# Patient Record
Sex: Female | Born: 1935
Health system: Southern US, Community
[De-identification: ages and names within clinical notes are randomized; demographics above are authoritative.]

## PROBLEM LIST (undated history)

## (undated) DIAGNOSIS — Z9289 Personal history of other medical treatment: Secondary | ICD-10-CM

## (undated) DIAGNOSIS — M545 Low back pain, unspecified: Secondary | ICD-10-CM

## (undated) DIAGNOSIS — D649 Anemia, unspecified: Secondary | ICD-10-CM

## (undated) DIAGNOSIS — C44300 Unspecified malignant neoplasm of skin of unspecified part of face: Secondary | ICD-10-CM

## (undated) DIAGNOSIS — E7800 Pure hypercholesterolemia, unspecified: Secondary | ICD-10-CM

## (undated) DIAGNOSIS — I82419 Acute embolism and thrombosis of unspecified femoral vein: Secondary | ICD-10-CM

## (undated) DIAGNOSIS — G8929 Other chronic pain: Secondary | ICD-10-CM

## (undated) DIAGNOSIS — I5022 Chronic systolic (congestive) heart failure: Secondary | ICD-10-CM

## (undated) DIAGNOSIS — K219 Gastro-esophageal reflux disease without esophagitis: Secondary | ICD-10-CM

## (undated) DIAGNOSIS — Z8719 Personal history of other diseases of the digestive system: Secondary | ICD-10-CM

## (undated) DIAGNOSIS — I10 Essential (primary) hypertension: Secondary | ICD-10-CM

## (undated) DIAGNOSIS — E119 Type 2 diabetes mellitus without complications: Secondary | ICD-10-CM

## (undated) DIAGNOSIS — I2510 Atherosclerotic heart disease of native coronary artery without angina pectoris: Secondary | ICD-10-CM

## (undated) DIAGNOSIS — C50919 Malignant neoplasm of unspecified site of unspecified female breast: Secondary | ICD-10-CM

## (undated) DIAGNOSIS — K222 Esophageal obstruction: Secondary | ICD-10-CM

## (undated) DIAGNOSIS — M1990 Unspecified osteoarthritis, unspecified site: Secondary | ICD-10-CM

## (undated) DIAGNOSIS — I2699 Other pulmonary embolism without acute cor pulmonale: Secondary | ICD-10-CM

## (undated) DIAGNOSIS — Z853 Personal history of malignant neoplasm of breast: Principal | ICD-10-CM

## (undated) DIAGNOSIS — C55 Malignant neoplasm of uterus, part unspecified: Secondary | ICD-10-CM

## (undated) DIAGNOSIS — Z7901 Long term (current) use of anticoagulants: Secondary | ICD-10-CM

## (undated) DIAGNOSIS — N189 Chronic kidney disease, unspecified: Secondary | ICD-10-CM

## (undated) DIAGNOSIS — M48061 Spinal stenosis, lumbar region without neurogenic claudication: Secondary | ICD-10-CM

## (undated) HISTORY — DX: Unspecified osteoarthritis, unspecified site: M19.90

## (undated) HISTORY — PX: CATARACT EXTRACTION W/ INTRAOCULAR LENS  IMPLANT, BILATERAL: SHX1307

## (undated) HISTORY — PX: DILATION AND CURETTAGE OF UTERUS: SHX78

## (undated) HISTORY — DX: Pure hypercholesterolemia, unspecified: E78.00

## (undated) HISTORY — DX: Essential (primary) hypertension: I10

## (undated) HISTORY — DX: Atherosclerotic heart disease of native coronary artery without angina pectoris: I25.10

## (undated) HISTORY — DX: Personal history of malignant neoplasm of breast: Z85.3

## (undated) HISTORY — PX: SHOULDER ARTHROSCOPY W/ ROTATOR CUFF REPAIR: SHX2400

---

## 1964-03-29 DIAGNOSIS — C55 Malignant neoplasm of uterus, part unspecified: Secondary | ICD-10-CM

## 1964-03-29 HISTORY — PX: ABDOMINAL HYSTERECTOMY: SHX81

## 1964-03-29 HISTORY — DX: Malignant neoplasm of uterus, part unspecified: C55

## 1964-03-29 HISTORY — PX: APPENDECTOMY: SHX54

## 1997-09-16 ENCOUNTER — Encounter: Admission: RE | Admit: 1997-09-16 | Discharge: 1997-12-15 | Payer: Self-pay | Admitting: Internal Medicine

## 1999-01-21 ENCOUNTER — Other Ambulatory Visit: Admission: RE | Admit: 1999-01-21 | Discharge: 1999-01-21 | Payer: Self-pay | Admitting: Obstetrics and Gynecology

## 1999-03-13 ENCOUNTER — Encounter: Payer: Self-pay | Admitting: Obstetrics and Gynecology

## 1999-03-13 ENCOUNTER — Encounter: Admission: RE | Admit: 1999-03-13 | Discharge: 1999-03-13 | Payer: Self-pay | Admitting: Obstetrics and Gynecology

## 2000-05-12 ENCOUNTER — Other Ambulatory Visit: Admission: RE | Admit: 2000-05-12 | Discharge: 2000-05-12 | Payer: Self-pay | Admitting: Obstetrics and Gynecology

## 2000-05-24 ENCOUNTER — Encounter: Payer: Self-pay | Admitting: Obstetrics and Gynecology

## 2000-05-24 ENCOUNTER — Encounter: Admission: RE | Admit: 2000-05-24 | Discharge: 2000-05-24 | Payer: Self-pay | Admitting: Obstetrics and Gynecology

## 2001-01-26 ENCOUNTER — Other Ambulatory Visit: Admission: RE | Admit: 2001-01-26 | Discharge: 2001-01-26 | Payer: Self-pay | Admitting: Obstetrics and Gynecology

## 2001-03-15 ENCOUNTER — Encounter: Payer: Self-pay | Admitting: Internal Medicine

## 2001-03-15 ENCOUNTER — Encounter: Admission: RE | Admit: 2001-03-15 | Discharge: 2001-03-15 | Payer: Self-pay | Admitting: Internal Medicine

## 2001-04-03 ENCOUNTER — Ambulatory Visit (HOSPITAL_COMMUNITY): Admission: RE | Admit: 2001-04-03 | Discharge: 2001-04-03 | Payer: Self-pay | Admitting: Obstetrics and Gynecology

## 2001-04-03 ENCOUNTER — Encounter: Payer: Self-pay | Admitting: Obstetrics and Gynecology

## 2001-06-02 ENCOUNTER — Encounter: Admission: RE | Admit: 2001-06-02 | Discharge: 2001-06-02 | Payer: Self-pay | Admitting: Internal Medicine

## 2001-06-02 ENCOUNTER — Encounter: Payer: Self-pay | Admitting: Internal Medicine

## 2003-01-30 ENCOUNTER — Inpatient Hospital Stay (HOSPITAL_COMMUNITY): Admission: AD | Admit: 2003-01-30 | Discharge: 2003-02-01 | Payer: Self-pay | Admitting: Internal Medicine

## 2003-01-31 ENCOUNTER — Encounter (INDEPENDENT_AMBULATORY_CARE_PROVIDER_SITE_OTHER): Payer: Self-pay | Admitting: Specialist

## 2003-03-02 ENCOUNTER — Emergency Department (HOSPITAL_COMMUNITY): Admission: EM | Admit: 2003-03-02 | Discharge: 2003-03-02 | Payer: Self-pay | Admitting: Emergency Medicine

## 2003-03-26 ENCOUNTER — Inpatient Hospital Stay (HOSPITAL_COMMUNITY): Admission: EM | Admit: 2003-03-26 | Discharge: 2003-04-03 | Payer: Self-pay | Admitting: Emergency Medicine

## 2003-03-28 ENCOUNTER — Encounter: Payer: Self-pay | Admitting: Cardiology

## 2003-03-30 HISTORY — PX: CORONARY ARTERY BYPASS GRAFT: SHX141

## 2003-04-01 HISTORY — PX: CORONARY ANGIOPLASTY WITH STENT PLACEMENT: SHX49

## 2003-07-31 ENCOUNTER — Encounter (HOSPITAL_COMMUNITY): Admission: RE | Admit: 2003-07-31 | Discharge: 2003-09-09 | Payer: Self-pay | Admitting: Internal Medicine

## 2003-09-13 ENCOUNTER — Encounter: Admission: RE | Admit: 2003-09-13 | Discharge: 2003-09-13 | Payer: Self-pay | Admitting: Gastroenterology

## 2003-11-07 ENCOUNTER — Inpatient Hospital Stay (HOSPITAL_COMMUNITY): Admission: AD | Admit: 2003-11-07 | Discharge: 2003-11-20 | Payer: Self-pay | Admitting: Cardiology

## 2003-11-08 ENCOUNTER — Encounter: Payer: Self-pay | Admitting: Cardiology

## 2003-12-13 ENCOUNTER — Encounter: Admission: RE | Admit: 2003-12-13 | Discharge: 2003-12-13 | Payer: Self-pay | Admitting: Cardiothoracic Surgery

## 2004-01-24 IMAGING — CT CT HEAD W/O CM
1 series · 16 of 28 positions shown, 20 images · non-contrast
Comparison: none

CLINICAL DATA: Fall.  
 CT BRAIN WITHOUT CONTRAST
 There is mild atrophy and mild chronic ischemic change in the white matter.  No intracranial hemorrhage or mass is seen.  Soft tissue swelling and laceration is seen in the left frontal area. There is no skull fracture. 
 IMPRESSION
 No acute intracranial abnormality. 

 ree

[Series 2: trauma head · axial · 0.47mm/px · z∈[+123,+251]mm · 16 of 28 slices shown, 20 images]
[im 2/28  brain]
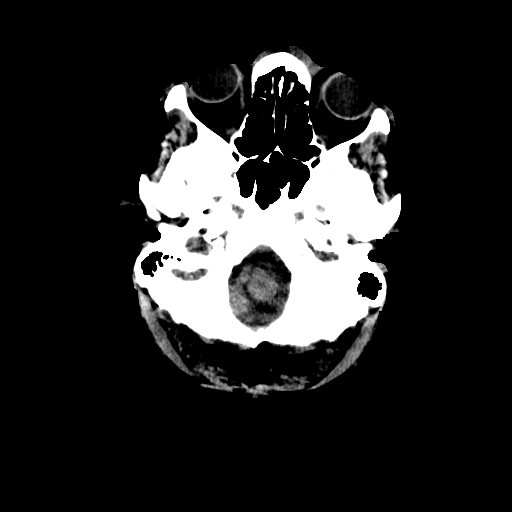
[im 2/28  bone]
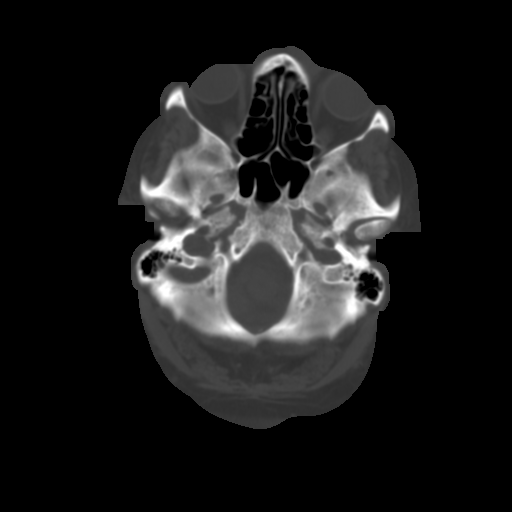
[im 4/28  brain]
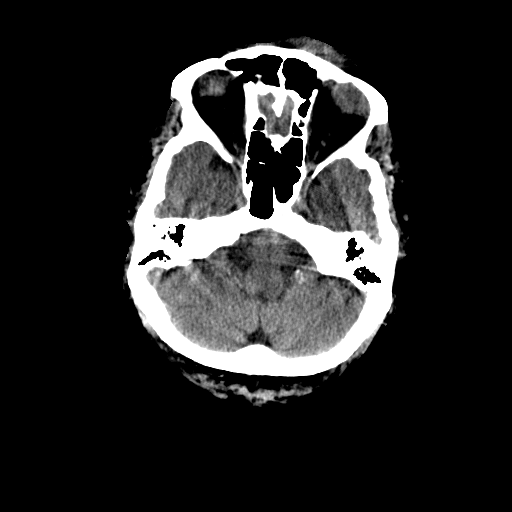
[im 6/28  brain]
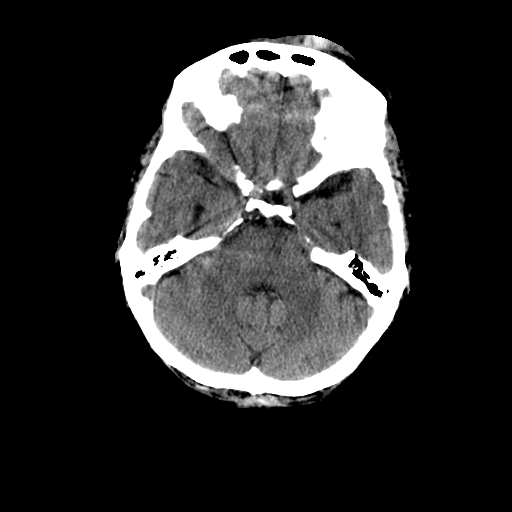
[im 7/28  brain]
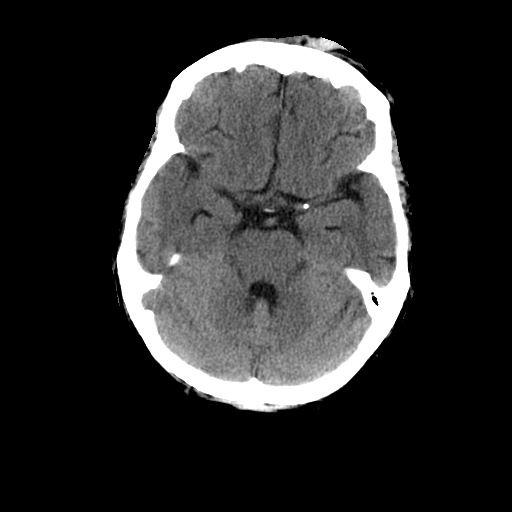
[im 9/28  brain]
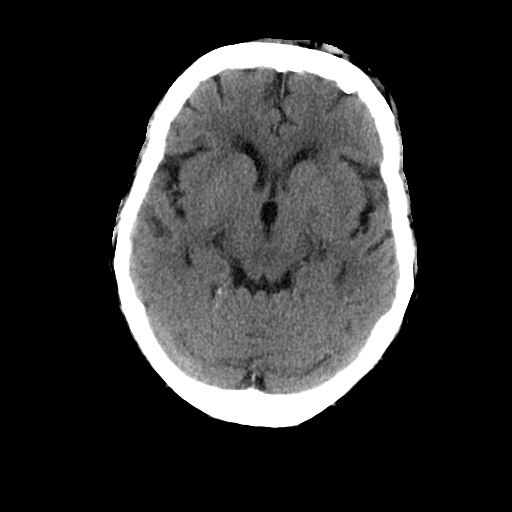
[im 9/28  bone]
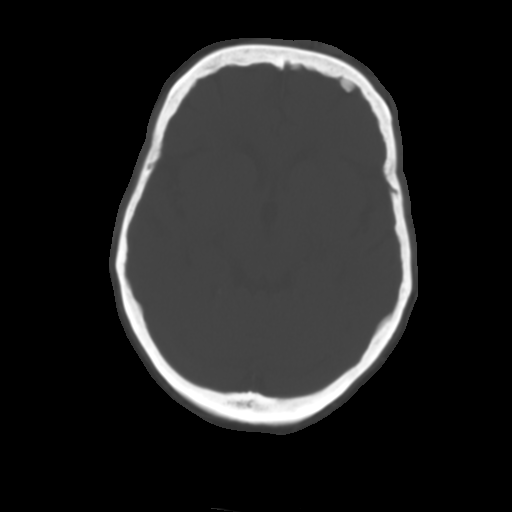
[im 10/28  brain]
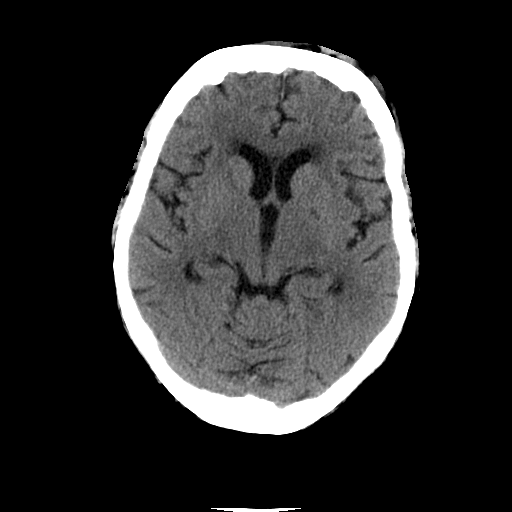
[im 12/28  brain]
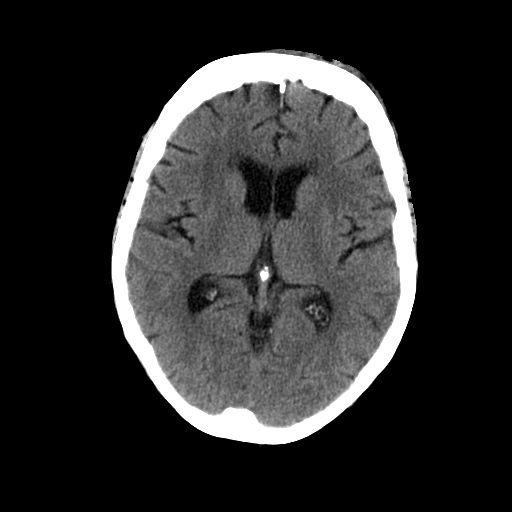
[im 14/28  brain]
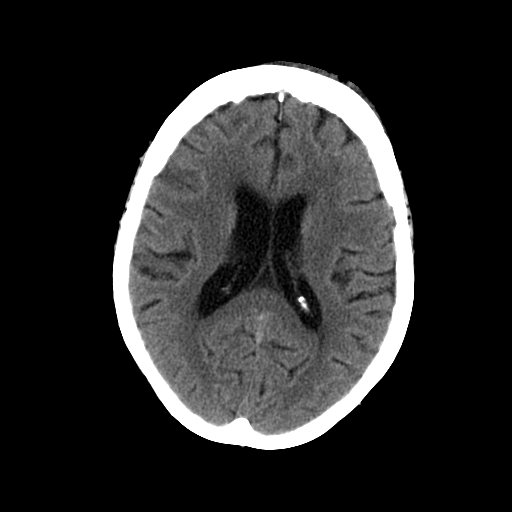
[im 15/28  brain]
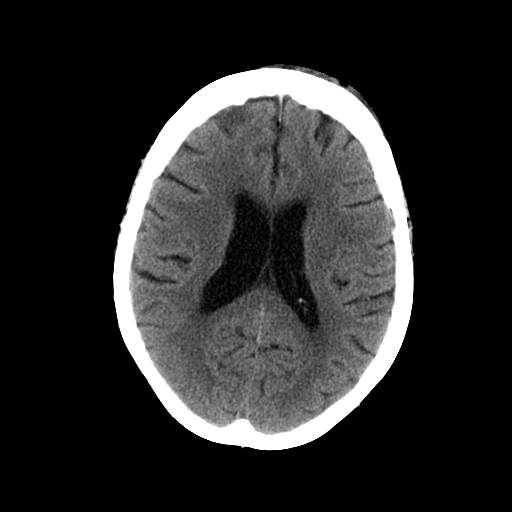
[im 15/28  bone]
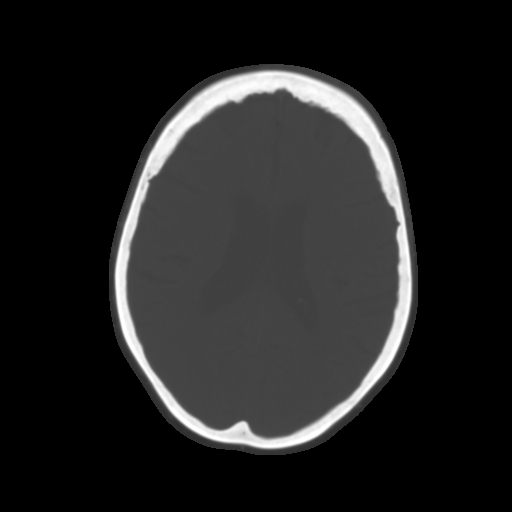
[im 17/28  brain]
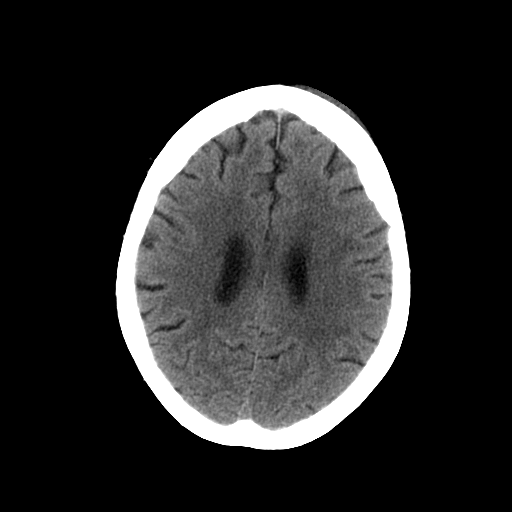
[im 19/28  brain]
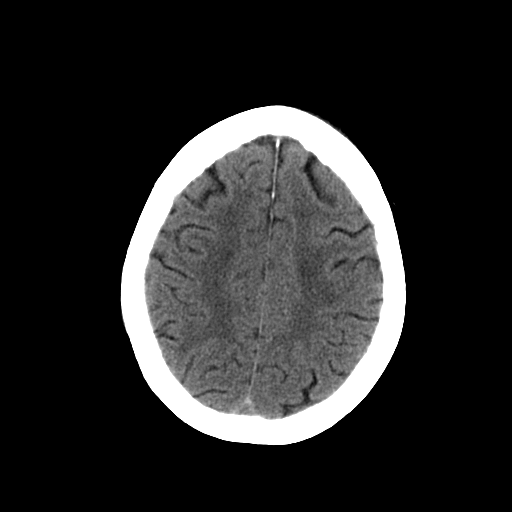
[im 20/28  brain]
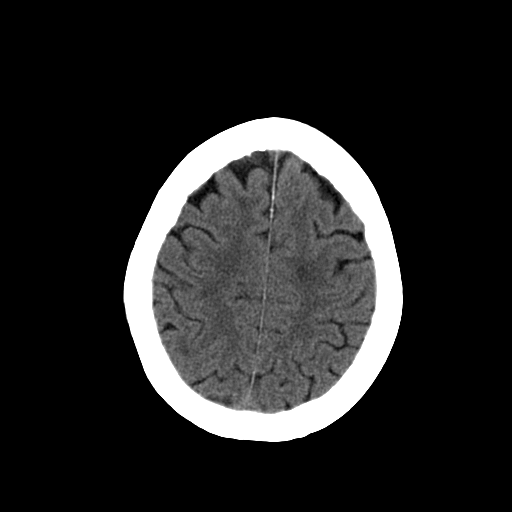
[im 22/28  brain]
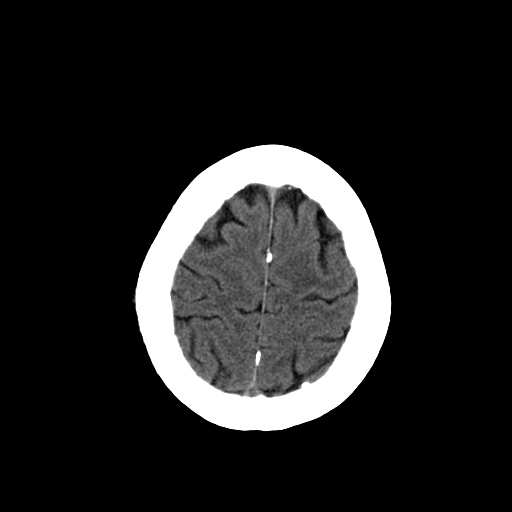
[im 22/28  bone]
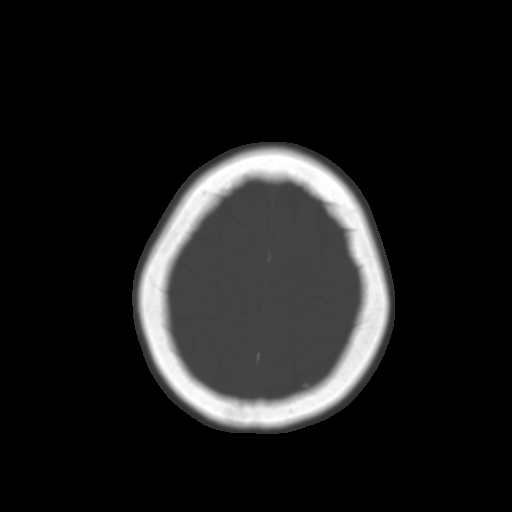
[im 23/28  brain]
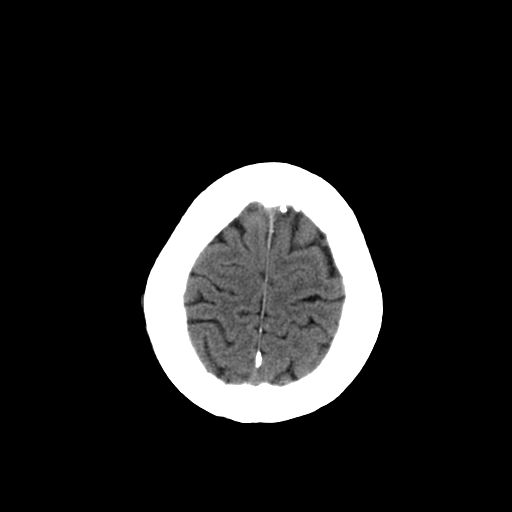
[im 25/28  brain]
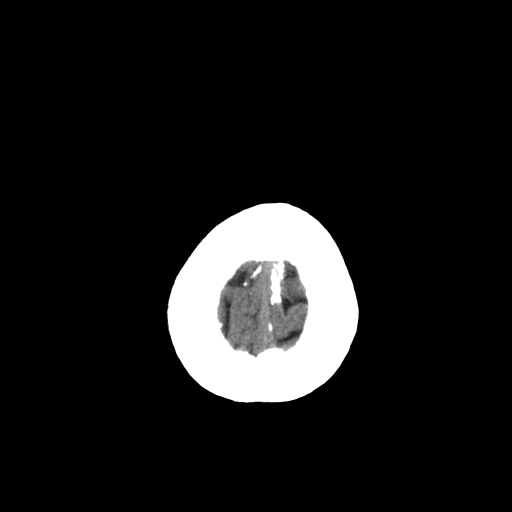
[im 27/28  brain]
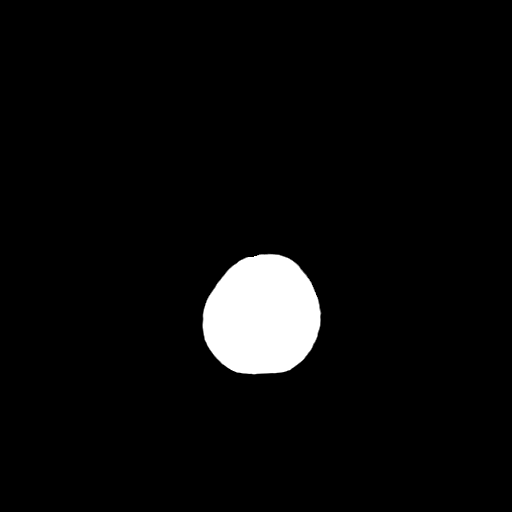

[16 of 28 positions shown; findings below may reference images not displayed]

## 2004-11-05 IMAGING — CR DG CHEST 2V
2 series · 2 of 2 positions shown · non-contrast
Comparison: 11/17/03.

CLINICAL DATA: Coronary artery disease.
 PA AND LATERAL CHEST ? 12/13/03

[view not recorded (1 of 2)]
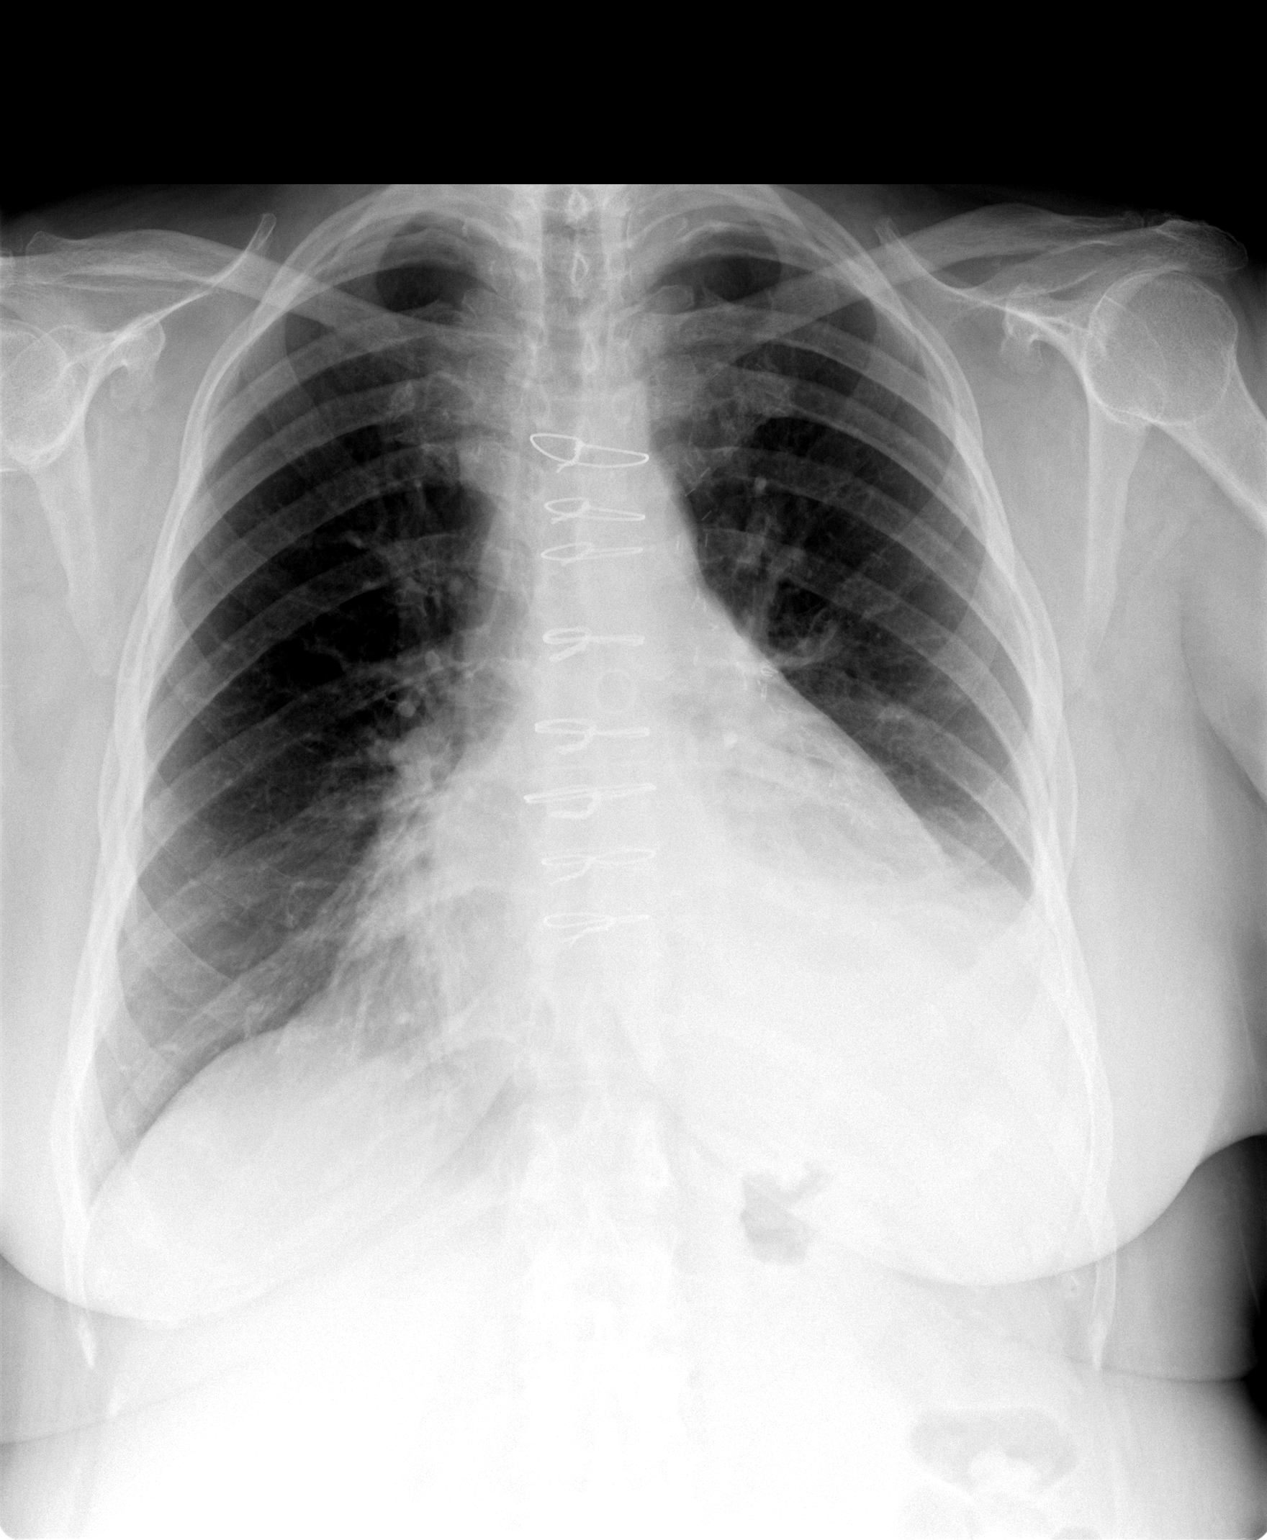

[view not recorded (2 of 2)]
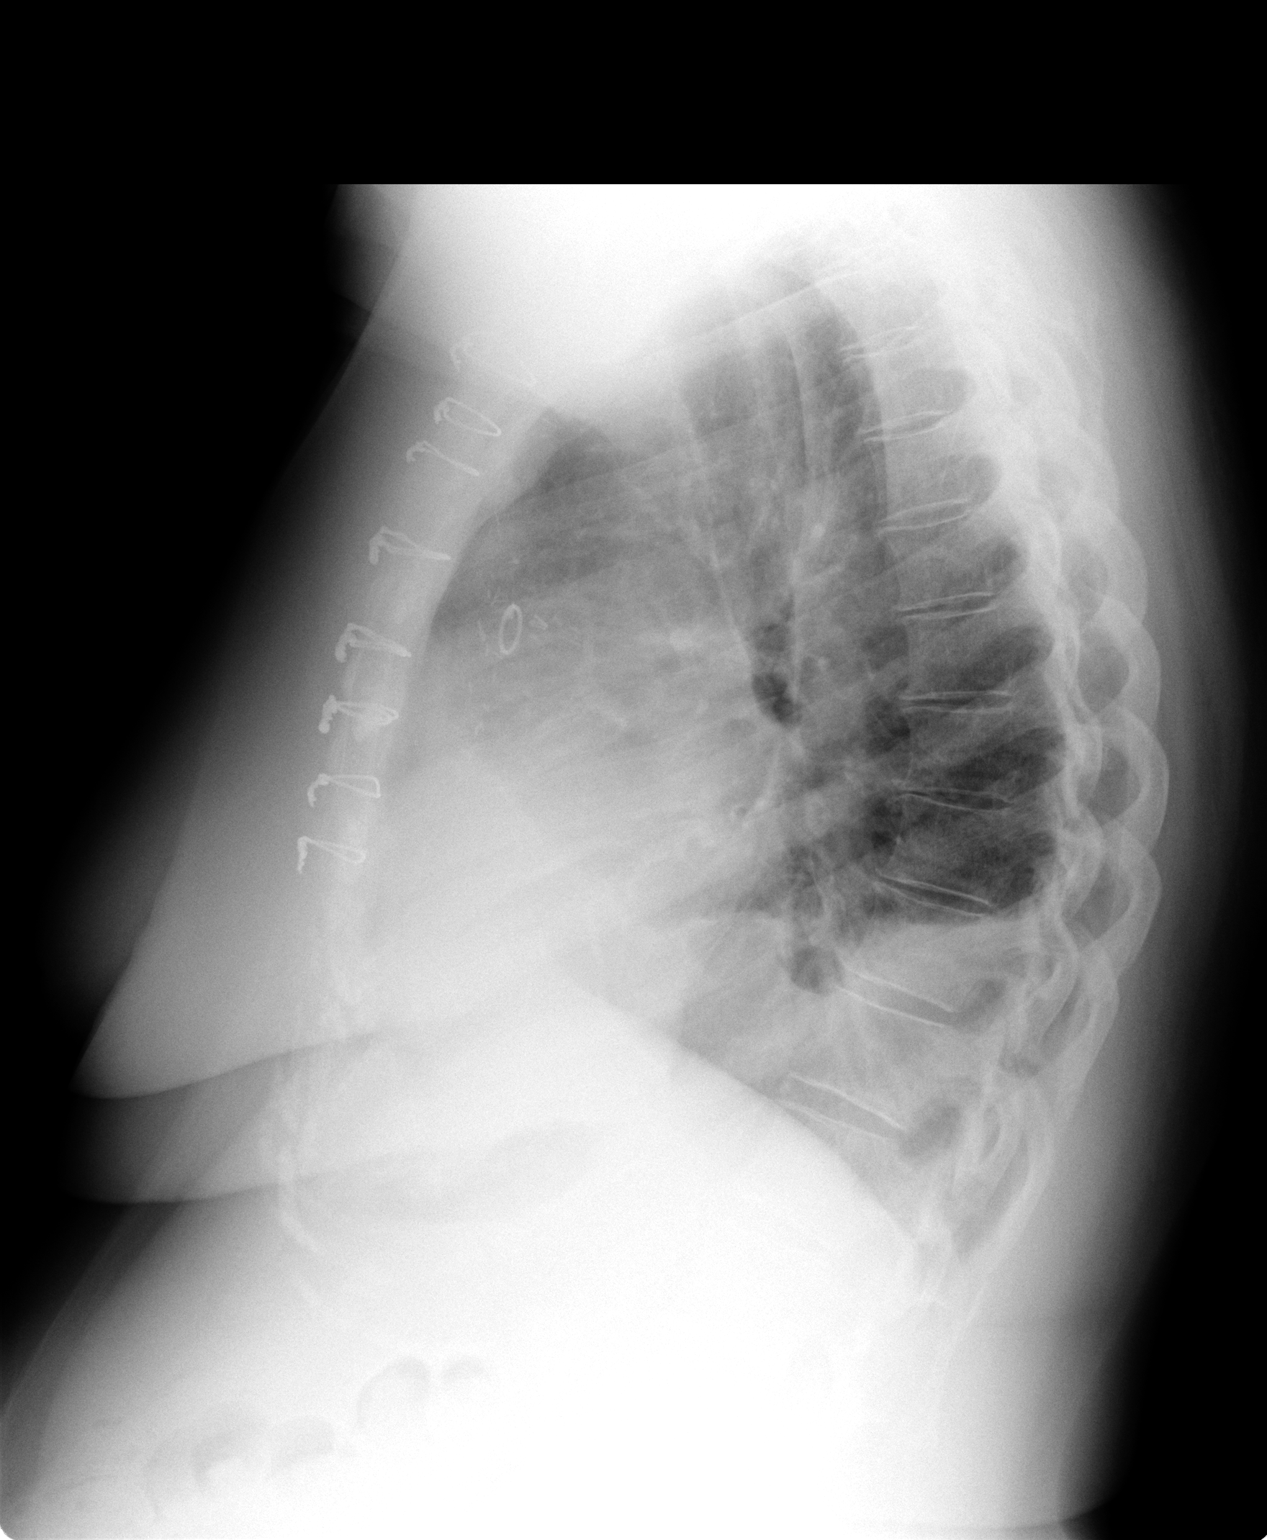

[2 of 2 positions shown; findings below may reference images not displayed]

There is mild cardiomegaly.  The pulmonary vascularity is normal.  Right effusion has resolved since the prior exam. However, the left effusion has increased moderately.  
 Evidence of previous CABG.
 IMPRESSION
 Increase in left effusion.  Resolution of the effusion on the right.  Resolution of right base atelectasis.

## 2005-10-26 ENCOUNTER — Encounter: Admission: RE | Admit: 2005-10-26 | Discharge: 2005-10-26 | Payer: Self-pay | Admitting: Endocrinology

## 2008-03-29 HISTORY — PX: BREAST LUMPECTOMY: SHX2

## 2008-04-25 ENCOUNTER — Encounter (HOSPITAL_BASED_OUTPATIENT_CLINIC_OR_DEPARTMENT_OTHER): Payer: Self-pay | Admitting: Internal Medicine

## 2008-04-25 ENCOUNTER — Ambulatory Visit (HOSPITAL_COMMUNITY): Admission: RE | Admit: 2008-04-25 | Discharge: 2008-04-25 | Payer: Self-pay | Admitting: Internal Medicine

## 2008-05-21 ENCOUNTER — Ambulatory Visit (HOSPITAL_COMMUNITY): Admission: RE | Admit: 2008-05-21 | Discharge: 2008-05-21 | Payer: Self-pay | Admitting: Surgery

## 2008-05-21 ENCOUNTER — Encounter (INDEPENDENT_AMBULATORY_CARE_PROVIDER_SITE_OTHER): Payer: Self-pay | Admitting: Surgery

## 2008-05-23 ENCOUNTER — Ambulatory Visit: Payer: Self-pay | Admitting: Oncology

## 2008-11-03 ENCOUNTER — Ambulatory Visit: Payer: Self-pay | Admitting: Internal Medicine

## 2008-11-03 ENCOUNTER — Inpatient Hospital Stay (HOSPITAL_COMMUNITY): Admission: EM | Admit: 2008-11-03 | Discharge: 2008-11-06 | Payer: Self-pay | Admitting: Emergency Medicine

## 2008-11-04 ENCOUNTER — Ambulatory Visit: Payer: Self-pay | Admitting: Gastroenterology

## 2008-11-04 ENCOUNTER — Encounter: Payer: Self-pay | Admitting: Internal Medicine

## 2008-11-07 ENCOUNTER — Encounter: Payer: Self-pay | Admitting: Internal Medicine

## 2008-11-07 DIAGNOSIS — D649 Anemia, unspecified: Secondary | ICD-10-CM

## 2008-11-11 ENCOUNTER — Telehealth (INDEPENDENT_AMBULATORY_CARE_PROVIDER_SITE_OTHER): Payer: Self-pay

## 2008-11-12 ENCOUNTER — Telehealth (INDEPENDENT_AMBULATORY_CARE_PROVIDER_SITE_OTHER): Payer: Self-pay

## 2008-12-12 ENCOUNTER — Ambulatory Visit: Payer: Self-pay | Admitting: Internal Medicine

## 2008-12-12 DIAGNOSIS — Z860100 Personal history of colon polyps, unspecified: Secondary | ICD-10-CM | POA: Insufficient documentation

## 2008-12-12 DIAGNOSIS — R1310 Dysphagia, unspecified: Secondary | ICD-10-CM | POA: Insufficient documentation

## 2008-12-12 DIAGNOSIS — Z8719 Personal history of other diseases of the digestive system: Secondary | ICD-10-CM

## 2008-12-12 DIAGNOSIS — K222 Esophageal obstruction: Secondary | ICD-10-CM

## 2008-12-12 DIAGNOSIS — K2210 Ulcer of esophagus without bleeding: Secondary | ICD-10-CM | POA: Insufficient documentation

## 2008-12-16 ENCOUNTER — Encounter: Payer: Self-pay | Admitting: Internal Medicine

## 2009-01-08 ENCOUNTER — Encounter: Payer: Self-pay | Admitting: Internal Medicine

## 2009-01-08 ENCOUNTER — Ambulatory Visit (HOSPITAL_COMMUNITY): Admission: RE | Admit: 2009-01-08 | Discharge: 2009-01-08 | Payer: Self-pay | Admitting: Gastroenterology

## 2009-01-08 ENCOUNTER — Ambulatory Visit: Payer: Self-pay | Admitting: Internal Medicine

## 2009-01-12 ENCOUNTER — Encounter: Payer: Self-pay | Admitting: Internal Medicine

## 2009-01-14 ENCOUNTER — Encounter: Payer: Self-pay | Admitting: Internal Medicine

## 2009-11-10 ENCOUNTER — Encounter
Admission: RE | Admit: 2009-11-10 | Discharge: 2009-11-10 | Payer: Self-pay | Source: Home / Self Care | Admitting: Neurological Surgery

## 2009-12-18 ENCOUNTER — Ambulatory Visit: Payer: Self-pay | Admitting: Cardiology

## 2010-03-29 HISTORY — PX: MASTECTOMY PARTIAL / LUMPECTOMY: SUR851

## 2010-04-19 ENCOUNTER — Encounter (HOSPITAL_BASED_OUTPATIENT_CLINIC_OR_DEPARTMENT_OTHER): Payer: Self-pay | Admitting: Internal Medicine

## 2010-04-29 ENCOUNTER — Encounter (HOSPITAL_COMMUNITY): Payer: Medicare HMO | Attending: Internal Medicine

## 2010-04-29 DIAGNOSIS — Z01812 Encounter for preprocedural laboratory examination: Secondary | ICD-10-CM | POA: Insufficient documentation

## 2010-04-29 DIAGNOSIS — C50019 Malignant neoplasm of nipple and areola, unspecified female breast: Secondary | ICD-10-CM | POA: Insufficient documentation

## 2010-04-29 LAB — CBC
HCT: 35.6 % — ABNORMAL LOW (ref 36.0–46.0)
Hemoglobin: 11.1 g/dL — ABNORMAL LOW (ref 12.0–15.0)
MCH: 29.4 pg (ref 26.0–34.0)
MCHC: 31.2 g/dL (ref 30.0–36.0)
MCV: 94.2 fL (ref 78.0–100.0)
Platelets: 257 10*3/uL (ref 150–400)
RBC: 3.78 MIL/uL — ABNORMAL LOW (ref 3.87–5.11)
RDW: 13.4 % (ref 11.5–15.5)
WBC: 7.7 10*3/uL (ref 4.0–10.5)

## 2010-04-29 LAB — BASIC METABOLIC PANEL
BUN: 29 mg/dL — ABNORMAL HIGH (ref 6–23)
CO2: 26 mEq/L (ref 19–32)
Calcium: 9.5 mg/dL (ref 8.4–10.5)
Chloride: 108 mEq/L (ref 96–112)
Creatinine, Ser: 1.5 mg/dL — ABNORMAL HIGH (ref 0.4–1.2)
GFR calc Af Amer: 40 mL/min — ABNORMAL LOW (ref >60)
GFR calc non Af Amer: 33 mL/min — ABNORMAL LOW (ref >60)
Glucose, Bld: 67 mg/dL — ABNORMAL LOW (ref 70–99)
Potassium: 4.6 mEq/L (ref 3.5–5.1)
Sodium: 145 mEq/L (ref 135–145)

## 2010-04-29 LAB — SURGICAL PCR SCREEN
MRSA, PCR: NEGATIVE
Staphylococcus aureus: POSITIVE — AB

## 2010-04-30 ENCOUNTER — Ambulatory Visit (HOSPITAL_COMMUNITY)
Admission: RE | Admit: 2010-04-30 | Discharge: 2010-04-30 | Disposition: A | Discharge status: Home / Self Care | Payer: Medicare HMO | Attending: Surgery | Admitting: Surgery

## 2010-04-30 ENCOUNTER — Other Ambulatory Visit: Payer: Self-pay | Admitting: Surgery

## 2010-04-30 DIAGNOSIS — C50019 Malignant neoplasm of nipple and areola, unspecified female breast: Secondary | ICD-10-CM | POA: Insufficient documentation

## 2010-04-30 LAB — GLUCOSE, CAPILLARY
Glucose-Capillary: 86 mg/dL (ref 70–99)
Glucose-Capillary: 178 mg/dL — ABNORMAL HIGH (ref 70–99)

## 2010-05-02 NOTE — Op Note (Signed)
  [name], Jacqueline Zuniga                ACCOUNT NO.:  [id number]  MEDICAL RECORD NO.:  [id number]           PATIENT TYPE:  O  LOCATION:  SDSC                         FACILITY:  [facility]  PHYSICIAN:  Currie Paris, M.D.DATE OF BIRTH:  September 01, 1925  DATE OF PROCEDURE:  04/30/2010 DATE OF DISCHARGE:  04/30/2010                              OPERATIVE REPORT   PREOPERATIVE DIAGNOSIS:  Paget disease, left breast.  POSTOPERATIVE DIAGNOSIS:  Paget disease, left breast.  PROCEDURE:  Partial left mastectomy.  SURGEON:  Currie Paris, MD  ANESTHESIA:  General.  CLINICAL HISTORY:  This is an 77 year old lady that has been previously treated for a right breast cancer who recently presented with some changes in the nipple of the left breast.  Punch biopsy showed Paget's. Mammogram was unremarkable.  BSGI showed a 1.5-cm area directly under the nipple of activity, the rest of breast was normal.  After discussion with the patient, we elected to proceed to a central partial left mastectomy to include the nipple-areolar complex.  DESCRIPTION OF PROCEDURE:  I saw the patient in the holding area and she had no further questions.  We identified the left side as the operative side and I initialed the left breast.  The patient was taken to the operating room, and after satisfactory general (LMA) anesthesia had been obtained the left breast was prepped and draped.  The time-out was done.  I made an elliptical incision taking only the skin of the nipple-areolar complex.  I divided the breast tissue above the areola by making a small skin flap and going down to the chest wall and coming around medially at the medial end of my incision, then making a small skin flap inferiorly going down all the way to the fascia and taking full thickness of skin, subcu tissue, breast tissue.  This all appeared to be grossly normal tissue.  I made sure everything was dry.  I put 20 mL of 0.25% plain Marcaine  in. I put clips in to mark the margins of the excision.  The incision was then closed in layers with 3-0 Vicryl, 4-0 Monocryl subcuticular plus Dermabond.  The patient tolerated the procedure well.  There were no complications. Counts were correct.     Currie Paris, M.D.     CJS/MEDQ  D:  04/30/2010  T:  05/01/2010  Job:  [id number]  Electronically Signed by Cyndia Bent M.D. on 05/01/2010 07:29:16 AM

## 2010-05-15 ENCOUNTER — Ambulatory Visit: Payer: Medicare HMO | Attending: Radiation Oncology | Admitting: Radiation Oncology

## 2010-05-15 DIAGNOSIS — D0590 Unspecified type of carcinoma in situ of unspecified breast: Secondary | ICD-10-CM | POA: Insufficient documentation

## 2010-05-15 DIAGNOSIS — Z171 Estrogen receptor negative status [ER-]: Secondary | ICD-10-CM | POA: Insufficient documentation

## 2010-05-15 DIAGNOSIS — M889 Osteitis deformans of unspecified bone: Secondary | ICD-10-CM | POA: Insufficient documentation

## 2010-06-11 ENCOUNTER — Ambulatory Visit: Payer: Self-pay | Admitting: Cardiology

## 2010-06-19 ENCOUNTER — Ambulatory Visit: Payer: Self-pay | Admitting: Cardiology

## 2010-07-02 ENCOUNTER — Ambulatory Visit: Payer: Self-pay | Admitting: Cardiology

## 2010-07-02 LAB — GLUCOSE, CAPILLARY
Glucose-Capillary: 129 mg/dL — ABNORMAL HIGH (ref 70–99)
Glucose-Capillary: 63 mg/dL — ABNORMAL LOW (ref 70–99)

## 2010-07-04 LAB — GLUCOSE, CAPILLARY
Glucose-Capillary: 100 mg/dL — ABNORMAL HIGH (ref 70–99)
Glucose-Capillary: 107 mg/dL — ABNORMAL HIGH (ref 70–99)
Glucose-Capillary: 141 mg/dL — ABNORMAL HIGH (ref 70–99)
Glucose-Capillary: 147 mg/dL — ABNORMAL HIGH (ref 70–99)
Glucose-Capillary: 149 mg/dL — ABNORMAL HIGH (ref 70–99)
Glucose-Capillary: 156 mg/dL — ABNORMAL HIGH (ref 70–99)
Glucose-Capillary: 161 mg/dL — ABNORMAL HIGH (ref 70–99)
Glucose-Capillary: 161 mg/dL — ABNORMAL HIGH (ref 70–99)
Glucose-Capillary: 232 mg/dL — ABNORMAL HIGH (ref 70–99)
Glucose-Capillary: 242 mg/dL — ABNORMAL HIGH (ref 70–99)
Glucose-Capillary: 52 mg/dL — ABNORMAL LOW (ref 70–99)
Glucose-Capillary: 58 mg/dL — ABNORMAL LOW (ref 70–99)
Glucose-Capillary: 83 mg/dL (ref 70–99)
Glucose-Capillary: 93 mg/dL (ref 70–99)

## 2010-07-04 LAB — DIFFERENTIAL
Basophils Absolute: 0 10*3/uL (ref 0.0–0.1)
Basophils Absolute: 0 10*3/uL (ref 0.0–0.1)
Basophils Absolute: 0 10*3/uL (ref 0.0–0.1)
Basophils Absolute: 0 10*3/uL (ref 0.0–0.1)
Basophils Absolute: 0 10*3/uL (ref 0.0–0.1)
Basophils Relative: 0 % (ref 0–1)
Basophils Relative: 0 % (ref 0–1)
Basophils Relative: 0 % (ref 0–1)
Eosinophils Absolute: 0.3 10*3/uL (ref 0.0–0.7)
Lymphocytes Relative: 20 % (ref 12–46)
Lymphocytes Relative: 25 % (ref 12–46)
Lymphocytes Relative: 27 % (ref 12–46)
Lymphocytes Relative: 32 % (ref 12–46)
Lymphs Abs: 2.4 10*3/uL (ref 0.7–4.0)
Monocytes Absolute: 0.5 10*3/uL (ref 0.1–1.0)
Monocytes Absolute: 0.6 10*3/uL (ref 0.1–1.0)
Monocytes Absolute: 0.7 10*3/uL (ref 0.1–1.0)
Monocytes Relative: 9 % (ref 3–12)
Neutro Abs: 3.3 10*3/uL (ref 1.7–7.7)
Neutro Abs: 3.9 10*3/uL (ref 1.7–7.7)
Neutro Abs: 4.4 10*3/uL (ref 1.7–7.7)
Neutro Abs: 6.1 10*3/uL (ref 1.7–7.7)
Neutro Abs: 8.3 10*3/uL — ABNORMAL HIGH (ref 1.7–7.7)
Neutrophils Relative %: 56 % (ref 43–77)
Neutrophils Relative %: 74 % (ref 43–77)

## 2010-07-04 LAB — BASIC METABOLIC PANEL
BUN: 29 mg/dL — ABNORMAL HIGH (ref 6–23)
BUN: 46 mg/dL — ABNORMAL HIGH (ref 6–23)
BUN: 82 mg/dL — ABNORMAL HIGH (ref 6–23)
CO2: 22 mEq/L (ref 19–32)
CO2: 22 mEq/L (ref 19–32)
Calcium: 7.9 mg/dL — ABNORMAL LOW (ref 8.4–10.5)
Calcium: 8 mg/dL — ABNORMAL LOW (ref 8.4–10.5)
Calcium: 8.1 mg/dL — ABNORMAL LOW (ref 8.4–10.5)
Calcium: 8.3 mg/dL — ABNORMAL LOW (ref 8.4–10.5)
Calcium: 8.4 mg/dL (ref 8.4–10.5)
Calcium: 9.3 mg/dL (ref 8.4–10.5)
Creatinine, Ser: 1.52 mg/dL — ABNORMAL HIGH (ref 0.4–1.2)
Creatinine, Ser: 2 mg/dL — ABNORMAL HIGH (ref 0.4–1.2)
Creatinine, Ser: 3.42 mg/dL — ABNORMAL HIGH (ref 0.4–1.2)
Creatinine, Ser: 4.67 mg/dL — ABNORMAL HIGH (ref 0.4–1.2)
GFR calc Af Amer: 20 mL/min — ABNORMAL LOW (ref >60)
GFR calc Af Amer: 40 mL/min — ABNORMAL LOW (ref >60)
GFR calc non Af Amer: 12 mL/min — ABNORMAL LOW (ref >60)
GFR calc non Af Amer: 13 mL/min — ABNORMAL LOW (ref >60)
GFR calc non Af Amer: 16 mL/min — ABNORMAL LOW (ref >60)
GFR calc non Af Amer: 24 mL/min — ABNORMAL LOW (ref >60)
GFR calc non Af Amer: 9 mL/min — ABNORMAL LOW (ref >60)
Glucose, Bld: 109 mg/dL — ABNORMAL HIGH (ref 70–99)
Glucose, Bld: 122 mg/dL — ABNORMAL HIGH (ref 70–99)
Glucose, Bld: 149 mg/dL — ABNORMAL HIGH (ref 70–99)
Glucose, Bld: 87 mg/dL (ref 70–99)
Sodium: 136 mEq/L (ref 135–145)
Sodium: 139 mEq/L (ref 135–145)
Sodium: 140 mEq/L (ref 135–145)

## 2010-07-04 LAB — CBC
Hemoglobin: 9 g/dL — ABNORMAL LOW (ref 12.0–15.0)
Hemoglobin: 9.7 g/dL — ABNORMAL LOW (ref 12.0–15.0)
MCHC: 33.8 g/dL (ref 30.0–36.0)
MCHC: 34.8 g/dL (ref 30.0–36.0)
MCV: 92.2 fL (ref 78.0–100.0)
Platelets: 193 10*3/uL (ref 150–400)
Platelets: 195 10*3/uL (ref 150–400)
Platelets: 203 10*3/uL (ref 150–400)
Platelets: 262 10*3/uL (ref 150–400)
RBC: 2.95 MIL/uL — ABNORMAL LOW (ref 3.87–5.11)
RDW: 13.3 % (ref 11.5–15.5)
RDW: 13.6 % (ref 11.5–15.5)
RDW: 13.7 % (ref 11.5–15.5)
RDW: 13.9 % (ref 11.5–15.5)
RDW: 14.1 % (ref 11.5–15.5)
WBC: 9.2 10*3/uL (ref 4.0–10.5)

## 2010-07-04 LAB — PROTIME-INR
INR: 1.1 (ref 0.00–1.49)
Prothrombin Time: 14.8 seconds (ref 11.6–15.2)

## 2010-07-04 LAB — URINALYSIS, ROUTINE W REFLEX MICROSCOPIC
Glucose, UA: NEGATIVE mg/dL
Hgb urine dipstick: NEGATIVE
Ketones, ur: NEGATIVE mg/dL
Protein, ur: NEGATIVE mg/dL
Urobilinogen, UA: 0.2 mg/dL (ref 0.0–1.0)

## 2010-07-04 LAB — HEMOGLOBIN AND HEMATOCRIT, BLOOD
HCT: 27.1 % — ABNORMAL LOW (ref 36.0–46.0)
HCT: 28 % — ABNORMAL LOW (ref 36.0–46.0)
Hemoglobin: 9.3 g/dL — ABNORMAL LOW (ref 12.0–15.0)
Hemoglobin: 9.6 g/dL — ABNORMAL LOW (ref 12.0–15.0)

## 2010-07-04 LAB — ABO/RH: ABO/RH(D): A POS

## 2010-07-04 LAB — TYPE AND SCREEN

## 2010-07-04 LAB — HEPATIC FUNCTION PANEL
Albumin: 2.9 g/dL — ABNORMAL LOW (ref 3.5–5.2)
Total Protein: 5.3 g/dL — ABNORMAL LOW (ref 6.0–8.3)

## 2010-07-04 LAB — APTT: aPTT: 24 seconds (ref 24–37)

## 2010-07-04 LAB — HEMOGLOBIN A1C: Hgb A1c MFr Bld: 6.1 % (ref 4.6–6.1)

## 2010-07-04 LAB — TROPONIN I: Troponin I: 0.04 ng/mL (ref 0.00–0.06)

## 2010-07-04 LAB — CK TOTAL AND CKMB (NOT AT ARMC): Total CK: 126 U/L (ref 7–177)

## 2010-07-14 LAB — CBC
HCT: 34.3 % — ABNORMAL LOW (ref 36.0–46.0)
Hemoglobin: 11.1 g/dL — ABNORMAL LOW (ref 12.0–15.0)
MCHC: 32.5 g/dL (ref 30.0–36.0)
MCV: 90.1 fL (ref 78.0–100.0)
Platelets: 287 10*3/uL (ref 150–400)
RDW: 15.2 % (ref 11.5–15.5)

## 2010-07-14 LAB — DIFFERENTIAL
Basophils Absolute: 0 10*3/uL (ref 0.0–0.1)
Basophils Relative: 0 % (ref 0–1)
Lymphocytes Relative: 20 % (ref 12–46)
Monocytes Absolute: 0.5 10*3/uL (ref 0.1–1.0)
Monocytes Relative: 7 % (ref 3–12)
Neutro Abs: 4.7 10*3/uL (ref 1.7–7.7)
Neutrophils Relative %: 71 % (ref 43–77)

## 2010-07-14 LAB — URINALYSIS, ROUTINE W REFLEX MICROSCOPIC
Hgb urine dipstick: NEGATIVE
Nitrite: NEGATIVE
Specific Gravity, Urine: 1.018 (ref 1.005–1.030)
Urobilinogen, UA: 0.2 mg/dL (ref 0.0–1.0)
pH: 6 (ref 5.0–8.0)

## 2010-07-14 LAB — COMPREHENSIVE METABOLIC PANEL
Albumin: 4.1 g/dL (ref 3.5–5.2)
Alkaline Phosphatase: 31 U/L — ABNORMAL LOW (ref 39–117)
BUN: 31 mg/dL — ABNORMAL HIGH (ref 6–23)
Creatinine, Ser: 1.37 mg/dL — ABNORMAL HIGH (ref 0.4–1.2)
Glucose, Bld: 125 mg/dL — ABNORMAL HIGH (ref 70–99)
Potassium: 4 mEq/L (ref 3.5–5.1)
Total Protein: 7 g/dL (ref 6.0–8.3)

## 2010-07-14 LAB — URINE MICROSCOPIC-ADD ON

## 2010-07-14 LAB — GLUCOSE, CAPILLARY: Glucose-Capillary: 160 mg/dL — ABNORMAL HIGH (ref 70–99)

## 2010-07-20 ENCOUNTER — Encounter: Payer: Self-pay | Admitting: *Deleted

## 2010-07-20 DIAGNOSIS — I10 Essential (primary) hypertension: Secondary | ICD-10-CM

## 2010-07-20 DIAGNOSIS — I2510 Atherosclerotic heart disease of native coronary artery without angina pectoris: Secondary | ICD-10-CM

## 2010-07-20 DIAGNOSIS — I509 Heart failure, unspecified: Secondary | ICD-10-CM

## 2010-07-20 DIAGNOSIS — E7800 Pure hypercholesterolemia, unspecified: Secondary | ICD-10-CM

## 2010-07-21 ENCOUNTER — Encounter: Payer: Self-pay | Admitting: *Deleted

## 2010-07-21 DIAGNOSIS — I10 Essential (primary) hypertension: Secondary | ICD-10-CM | POA: Insufficient documentation

## 2010-07-21 DIAGNOSIS — E7800 Pure hypercholesterolemia, unspecified: Secondary | ICD-10-CM | POA: Insufficient documentation

## 2010-07-23 ENCOUNTER — Ambulatory Visit (INDEPENDENT_AMBULATORY_CARE_PROVIDER_SITE_OTHER): Payer: Medicare HMO | Admitting: Cardiology

## 2010-07-23 ENCOUNTER — Encounter: Payer: Self-pay | Admitting: Cardiology

## 2010-07-23 DIAGNOSIS — I10 Essential (primary) hypertension: Secondary | ICD-10-CM

## 2010-07-23 DIAGNOSIS — E7800 Pure hypercholesterolemia, unspecified: Secondary | ICD-10-CM

## 2010-07-23 DIAGNOSIS — I2510 Atherosclerotic heart disease of native coronary artery without angina pectoris: Secondary | ICD-10-CM

## 2010-07-23 NOTE — Patient Instructions (Signed)
Continue your current medications.   Watch your blood pressure.  We will see you back again in 6 months.

## 2010-07-23 NOTE — Assessment & Plan Note (Signed)
Continue simvastatin therapy. She is scheduled for followup lab work with Dr. Jacky Kindle.

## 2010-07-23 NOTE — Progress Notes (Signed)
Jacqueline Zuniga Date of Birth: [birth year]   History of Present Illness: Jacqueline Zuniga is seen for followup today. She states she's been doing very well from a cardiac standpoint. She was diagnosed with breast cancer in her left breast which was a different type than in her right breast. She underwent partial mastectomy and radiation therapy. She does complain of some fatigue. She has had no chest pain or shortness of breath.  Current Outpatient Prescriptions on File Prior to Visit  Medication Sig Dispense Refill  . B Complex-C (SUPER B COMPLEX PO) Take 1 tablet by mouth daily.        . Calcium Carbonate-Vitamin D (CALCIUM + D PO) Take 1 tablet by mouth 2 (two) times daily.        . carvedilol (COREG) 12.5 MG tablet Take 12.5 mg by mouth 2 (two) times daily with a meal.        . Cholecalciferol (VITAMIN D3) 400 UNITS tablet Take 400 Units by mouth daily.        . Choline Fenofibrate 135 MG capsule Take 135 mg by mouth daily.        . Coenzyme Q10 (COQ-10 PO) Take 1 tablet by mouth daily.        Marland Kitchen DIOVAN HCT 160-12.5 MG per tablet Take 1 tablet by mouth Daily.      . Ferrous Sulfate Dried 140 (45 FE) MG TBCR Take 1 tablet by mouth daily.        . furosemide (LASIX) 20 MG tablet Take 20 mg by mouth daily. Taking prn      . gabapentin (NEURONTIN) 100 MG capsule Take 300 mg by mouth 4 (four) times daily.        . hydrocodone-acetaminophen (LORCET-HD) 5-500 MG per capsule Take 1 capsule by mouth as needed.        . insulin detemir (LEVEMIR) 100 UNIT/ML injection Inject into the skin 2 (two) times daily. As directed        . insulin NPH (HUMULIN N,NOVOLIN N) 100 UNIT/ML injection Inject into the skin 2 (two) times daily. As directed       . metFORMIN (GLUCOPHAGE) 1000 MG tablet Take 1,000 mg by mouth 2 (two) times daily with a meal.        . multivitamin (THERAGRAN) per tablet Take 1 tablet by mouth daily.        . nitroGLYCERIN (NITROSTAT) 0.4 MG SL tablet Place 0.4 mg under the tongue every 5  (five) minutes as needed.        Marland Kitchen omeprazole (PRILOSEC) 20 MG capsule Take 20 mg by mouth daily.        . simvastatin (ZOCOR) 40 MG tablet Take 40 mg by mouth at bedtime.        . TAMOXIFEN CITRATE PO Take 1 capsule by mouth daily.        . vitamin B-12 (CYANOCOBALAMIN) 500 MCG tablet Take 500 mcg by mouth daily.        . vitamin E 400 UNIT capsule Take 400 Units by mouth daily.        Marland Kitchen aspirin 81 MG tablet Take 81 mg by mouth daily.        Marland Kitchen olmesartan-hydrochlorothiazide (BENICAR HCT) 40-12.5 MG per tablet Take 1 tablet by mouth daily.          Allergies  Allergen Reactions  . Morphine     Past Medical History  Diagnosis Date  . Hypertension   . Diabetes mellitus   .  Hypercholesterolemia   . Breast cancer, stage 1   . Bleeding gastric ulcer   . Esophageal ulcer with bleeding   . Coronary artery disease   . CHF (congestive heart failure)     history of  . History of anemia     chronic    Past Surgical History  Procedure Date  . Coronary artery bypass graft   . Asd repair 11/13/2003    x2 (left internal mammary artery to left anterior descending coronary artery, saphenous vein graft to diagonal)   . Cardiac catheterization 04/01/2003    1. Left heart catheterization. 2. Coronary angiography  3. Left ventricular angiography. 4 . Stenting of the mid left anterior descending.  . Rotator cuff repair   . Breast lumpectomy     right  . Mastectomy partial / lumpectomy     left    History  Smoking status  . Former Smoker -- 1.0 packs/day for 20 years  . Types: Cigarettes  . Quit date: 03/29/1964  Smokeless tobacco  . Never Used    History  Alcohol Use No    Family History  Problem Relation Age of Onset  . Heart failure Mother   . Heart attack Brother   . Coronary artery disease Father   . Heart failure Sister   . Stroke Brother     had bypass surgery    Review of Systems: The review of systems is positive for fatigue.  She states her blood pressure readings  at home have been improved.All other systems were reviewed and are negative.  Physical Exam: BP 156/70  Pulse 70  Wt 186 lb (84.369 kg) She is an overweight white female in no acute distress. HEENT exam is unremarkable. She has no JVD or bruits. Lungs are clear. Cardiac exam reveals a regular rate and rhythm without gallop, murmur, or click. Abdomen is soft and nontender. She has no edema. Pedal pulses are good. LABORATORY DATA:   Assessment / Plan:

## 2010-07-23 NOTE — Assessment & Plan Note (Signed)
Blood pressure is mildly elevated today but she reports much better readings at home. I've asked that she continue to monitor this. I made no changes in her medical therapy today.

## 2010-07-23 NOTE — Assessment & Plan Note (Signed)
Stress Cardiolite study in February of 2010 was normal. She is asymptomatic. We will continue with her current cardiac therapy.

## 2010-08-11 NOTE — Op Note (Signed)
Jacqueline Zuniga, Jacqueline Zuniga                ACCOUNT NO.:  [id number]   MEDICAL RECORD NO.:  [id number]          PATIENT TYPE:  AMB   LOCATION:  DAY                          [facility]ACILITY:  [facility]   PHYSICIAN:  Currie Paris, M.D.DATE OF BIRTH:  1925-04-23   DATE OF PROCEDURE:  DATE OF DISCHARGE:  05/21/2008                               OPERATIVE REPORT   OFFICE MEDICAL RECORD NUMBER:  CCS [id number].   PREOPERATIVE DIAGNOSIS:  Carcinoma, right breast upper outer quadrant.   POSTOPERATIVE DIAGNOSIS:  Carcinoma, right breast upper outer quadrant.   OPERATION:  Right partial mastectomy.   SURGEON:  Currie Paris, M.D.   ANESTHESIA:  General.   CLINICAL HISTORY:  This is an 77 year old lady who recently presented  with a right breast mass that proved to be invasive ductal carcinoma on  biopsy.  After discussion with the patient, she elected to proceed to a  lumpectomy (partial mastectomy).  She was presented at the cancer  conference and it was decided that an axillary dissection or sentinel  node evaluation would not add anything to postoperative plans and  therefore was not scheduled to be done today.   DESCRIPTION OF PROCEDURE:  The patient was seen in the holding area and  she had no further questions.  We confirmed that the right breast was  the operative site and I initialed that.   The patient was taken to the operating room and after satisfactory  general anesthesia had been obtained, the right breast was prepped and  draped.  The time-out was done.   The mass was readily palpable just above the 9 o'clock position.  I  outlined it and made an elliptical incision.  I elevated skin flaps;  first superiorly, then medially, then inferiorly, and finally laterally.  I then divided the breast tissue down to the chest wall superiorly, then  came around down to the chest wall medially, then inferiorly, and then  came under this, trying to stay right on the chest wall but there was a  little 2-mm layer of tissue left behind.  When we had freed up in all  those directions, I was able to traction up on it and divide it  laterally.  I inked it for orientation purposes.   I checked for hemostasis.  I went ahead and took the additional 2 or 3  mm so I had a complete deep margin down to and including fascia.  There  was some granular tissue along the superior edge and I took that  anterior superior margin as an extra piece of tissue was well.   I put Marcaine in (0.25% plain) to help with postop analgesia.  I freed  up the breast tissue off of the fascia and closed the deeper layers with  some 3-0 Vicryl, then the more superficial layers with 3-0 Vicryl and  the skin with 4-0  Monocryl and subcuticular Dermabond.  I placed several small clips in to  mark the margins of the lumpectomy cavity prior to closing.   The patient tolerated the procedure well.  There were no operative  complications and all counts were correct.      Currie Paris, M.D.  Electronically Signed     CJS/MEDQ  D:  05/21/2008  T:  05/22/2008  Job:  [id number]   cc:   Ivery Quale, MD

## 2010-08-11 NOTE — Discharge Summary (Signed)
Jacqueline Zuniga, Jacqueline Zuniga                ACCOUNT NO.:  [id number]   MEDICAL RECORD NO.:  [id number]          PATIENT TYPE:  INP   LOCATION:  A301                          FACILITY:  [facility]   PHYSICIAN:  Jacqueline Shipper, MD     DATE OF BIRTH:  1925/11/20   DATE OF ADMISSION:  11/03/2008  DATE OF DISCHARGE:  08/11/2010LH                               DISCHARGE SUMMARY   The patient's PMD is Dr. Jarold Zuniga in [place].   During this hospitalization, the patient was seen by Gastroenterology.   PROCEDURES:  EGD, which showed distal esophageal ulcerations consistent  with ulcerative reflux esophagitis.  Schatzki ring was seen as well.  Extensive bulbar ulceration with the ulcers, not having any bleeding  stigmata.   DISCHARGE DIAGNOSES:  1. Gastrointestinal bleeding, resolved.  2. Acute blood loss anemia, stable.  3. Acute renal failure, improving.  4. History of coronary artery disease status post bypass surgery in      the past.  5. History of type 2 diabetes, stable.   Please review H and P dictated by Jacqueline Zuniga for details  regarding the patient's presenting illness.   BRIEF HOSPITAL COURSE:  Briefly, this is an 77 year old Caucasian female  who presented to the hospital after a near-syncopal episode.  The  patient's blood sugar was 76 at that time.  The patient was also  complaining of weakness over the past few days and reported dark colored  stool.  The patient was evaluated in the ED and was found to have a  hemoglobin of 8.0 with heme-positive black stool.  The patient was  admitted for further evaluation.  Initially, her blood pressures were  running low.  She was given fluids and she improved.  She was also  transfused 2 units of blood for her history of coronary artery disease.  The patient was also noted to have acute renal failure with a BUN of 104  and a creatinine of 4.6 and with IV hydration, these have come down to  29 and 1.52 respectively.   For her GI  bleeding, the patient was seen by Gastroenterology.  IV PPI  was initiated.  She underwent an EGD, the results of which have been  discussed above.  PPI b.i.d. has been recommended for 1 month and then  once daily.  Her aspirin and Plavix have been held until she calls Dr.  Jena Zuniga in 1 week to determine if she can be reinitiate it.   Hemoglobin this morning is 9.2.  She did not report anymore dark-colored  stool.   It should be noted that the patient was started on Septra by her PMD for  UTI few days prior to this admission and that could be one reason for  her acute renal failure.  Her UA here looked clean and so she was not  started on any antibiotics here.   This morning, the patient is feeling quite well.  She is feeling great.  Denies any complaints.  No dizziness or lightheadedness.  She is  ambulating.  Vital signs are all stable.  Blood pressures are little  bit  on the higher side of 153/63.  Her saturation is 97% on room air.  She  is ambulating in front of me with no difficulty.  Lungs are clear.  Cardiac examination is unremarkable.  Abdomen is soft.   Overall, she is stable for discharge with close followup with her  doctor.   DISCHARGE MEDICATIONS:  1. Protonix 40 mg p.o. b.i.d. for 1 month followed by once daily      indefinitely.  2. Restart metformin 1000 mg b.i.d. on August 12.  3. Discontinue aspirin and Plavix till told otherwise by Dr. Jena Zuniga.  4. Continue with gabapentin 300 mg q.6 h., simvastatin 20 mg at      bedtime, Benicar HCT 20/12.5 once daily, carvedilol unknown dose      twice daily, Trilipix unknown dose once daily, tamoxifen 20 mg once      daily.  Discontinue Septra.   FOLLOWUP:  With Dr. Jarold Zuniga in 1 week, with GI per Gastroenterology.   DIET:  Modified carbohydrate.   PHYSICAL ACTIVITY:  As tolerated.   I think GI has sent H. pylori titers and these are still pending.  We  will let them followup this lab.   TOTAL TIME OF THIS ENCOUNTER:   35 minutes.      Jacqueline Shipper, MD  Electronically Signed     GK/MEDQ  D:  11/06/2008  T:  11/06/2008  Job:  [id number]   cc:   Jacqueline Zuniga. Jacqueline Zuniga, M.D.  Fax: [phone]   Jacqueline Zuniga, M.D.  [id number]  [place]  [place] [address]

## 2010-08-11 NOTE — Group Therapy Note (Signed)
Jacqueline Zuniga, Jacqueline Zuniga                ACCOUNT NO.:  [id number]   MEDICAL RECORD NO.:  [id number]          PATIENT TYPE:  INP   LOCATION:  A301                          FACILITY:  [facility]   PHYSICIAN:  Melissa L. Ladona Ridgel, MD  DATE OF BIRTH:  Nov 12, 1925   DATE OF PROCEDURE:  11/05/2008  DATE OF DISCHARGE:                                 PROGRESS NOTE   SUBJECTIVE/>  The patient states that she is feeling much better; however, she did  have an urgent stool this morning for which she was incontinent on the  way to the bathroom.  There does appear to be an odor consistent with  possible passage of blood.  The patient does state that the stool was  dark in color.  Otherwise, she denies shortness of breath, chest pain,  abdominal pain, nausea, vomiting.  She has been able to tolerate her  diet.  She does have a little bit of soreness in her arms, especially on  the left where an IV was placed and removed.   T-max is 98.1, today is 98.9.  Blood pressure 146/57.  Pulse 70.  Respirations 20.  Saturation 97% on room air.  Her general intake has  been 2100 in and 3000 out.  She is reported as having one stool  yesterday and so for this day has had two.  GENERAL:  This is a moderately obese white female in no acute distress.  She is normocephalic, atraumatic.  Pupils equal, round, reactive to  light.  Extraocular muscles are intact.  Mucous membranes are moist.  NECK:  Supple.  There is no JVD.  No lymph nodes.  No carotid bruits.  Her sclerae are anicteric.  Mucous membranes are moist without oral  lesions.  I do not appreciate any thyromegaly.  CHEST:  Decreased but clear to auscultation.  There are no rhonchi,  rales, or wheezes.  CARDIOVASCULAR:  Regular rate and rhythm.  Positive S1 and S2.  No S3 or  S4.  No murmurs, rubs, or gallops.  ABDOMEN:  Obese, nontender, nondistended with positive bowel sounds.  There is no hepatosplenomegaly.  No guarding.  No rebound.  EXTREMITIES:  Show upper  extremities some trace edema.  She has a  cellulitis involving the left [facility] which is small, approximately 2 inches  x 1 inch.  There is no purulent material.   PERTINENT LABORATORIES:  Her white count is 7.2 with a hemoglobin of  9.0, hematocrit 26.2, and platelets of 195.  Her sodium is 142,  potassium 4.7, chloride 117, CO2 is 25, BUN is 46, creatinine is 2.0  which is down from 2.82, glucose is 122, calcium is 8.1.  Please note  that the previous hemoglobin was 9.6 and one subsequent to that was 9.0.   ASSESSMENT AND PLAN:  This is an 77 year old white female admitted with  nausea, vomiting, and evidence for acute gastrointestinal bleed.  Upon  arrival, she was noted to be hypotensive and her creatinine was elevated  as high as 4.67.  Please note that back in February I do see a slightly  elevated  creatinine of 1.37.  Otherwise, I have no indication that she  has any chronic renal insufficiency.  I did leave a message at is Dr.  Silvano Rusk office to try to review her outpatient chart.  1. Nausea, vomiting, diarrhea with acute gastrointestinal bleed.      Patient is being followed by GI.  She continues to be slightly      anemic and was incontinent of stool today.  She remains with no      fever or white count but again is having some loose, dark-colored      stools.  She has able to tolerate her diet.  We will continue her      on Protonix, heme check her stools.  Also check C. diff cultures      since she did have some outpatient antibiotics.  2. Renal insufficiency, unclear whether this is acute on chronic or      chronic.  The patient has been IV hydrated and I will try to      discontinue that today.  I suspect that some of the renal      insufficiency may have been related to hypotension during her acute      onset blood loss.  Currently her blood pressures have been stable.      She has no evidence for urinary tract infection so I will make her      n.p.o. in the morning and  double check an ultrasound of the kidneys      to assure that there is no evidence for chronic medical renal      disease.  We may consider Nephrology consult in the morning if her      creatinine does not improve significantly.  I am going to encourage      her to take p.o. fluids at this time to see which direction we are      heading with her creatinine.  3. Anemia secondary to acute blood loss.  Continues to hover in the      9s.  We will see where she is tomorrow.  She may need further      transfusion, especially since she has had 2 loose stools today      which may be related to further gastrointestinal losses.  Patient      has been off of her Plavix and aspirin and will likely need to be      for some time.  4. Ulcer of the stomach as stated above resulting in acute      gastrointestinal bleed.  We will continue her on proton pump      inhibition and we await her H. pylori studies.  5. Diabetes.  Her hypoglycemia seems to be modestly controlled.  She      does, however, have several blood glucoses greater than 180.  I      therefore will recommend that we add a low-dose Lantus at this time      as I do not wish to restart her metformin and we will see how she      does with Lantus and sliding scale insulin.  6. Hyperlipidemia.  We will continue her Zocor.  7. Deep venous thrombosis prophylaxis is with sequential compression      devices as she is a high bleeding risk.  8. History of hypertension, however, on admission was hypertensive      requiring transfusion.  Her ACE and her Coreg have been on hold  so      at this time we will continue to hold her ACE inhibitor because of      her renal insufficiency but I would like to try to restart her      Coreg the dose of which is unknown at this time and we will try to      clarify that and restart that for her.   TOTAL TIME IN THIS CASE:  30 minutes.      Melissa L. Ladona Ridgel, MD  Electronically Signed     MLT/MEDQ  D:   11/05/2008  T:  11/05/2008  Job:  [id number]

## 2010-08-11 NOTE — H&P (Signed)
Jacqueline Zuniga, Jacqueline Zuniga                ACCOUNT NO.:  [id number]   MEDICAL RECORD NO.:  [id number]          PATIENT TYPE:  INP   LOCATION:  IC06                          FACILITY:  [facility]   PHYSICIAN:  Della Goo, M.D. DATE OF BIRTH:  [birth year]   DATE OF ADMISSION:  11/03/2008  DATE OF DISCHARGE:  LH                              HISTORY & PHYSICAL   CHIEF COMPLAINT:  Low blood sugar.   HISTORY OF PRESENT ILLNESS:  This is an 78 year old female who was  brought to the emergency department at [facility] emergently  after almost passing out and falling from her bed to the floor in the  evening.  EMS was called and when they checked her blood sugar it was  found to be 76.  The patient was given food to eat and her blood sugar  increased and on recheck was found to be 242.  The patient had  complaints of continued weakness and reports being weak for several  days.  She also reported having diarrhea and passing black tarry stools  for three days.  The patient was transported to the emergency department  and further evaluated, and was found to have a hemoglobin level of 8 and  was heme-positive on the rectal examination which was performed.  The  patient also was found to be hypotensive with an initial intake blood  pressure of 76/33.  The patient was started on fluid resuscitation and  blood was ordered for transfusion.   The patient reports being sick over the past week and having urinary  tract infection symptoms.  She called her primary care physician, Dr.  Ivery Quale who prescribed Septra therapy for her.  She reports  taking the antibiotic and then beginning to have diarrhea and abdominal  pain.  The patient also reports having nausea and vomiting.  She denies  having any hematemesis.  The patient also reports suffering an episode  of syncope two days ago.  She stated that episode lasted about 1 minute  and she nearly felt to the ground but her significant other caught  her  before she fell.  The patient currently reports feeling weak and having  fatigue and malaise as well as lightheadedness.  The patient's blood  pressure has responded to administration of fluids.   PAST MEDICAL HISTORY:  Significant for coronary artery disease, status  post coronary artery bypass grafting x2 vessels in August 2005, status  post PTCA with stent placement of the LAD in 2005, type 2 diabetes  mellitus, hypertension, hyperlipidemia, peptic ulcer disease, previous  GI bleeding in 2004 secondary to a duodenal ulcer, status post EGD and  colonoscopies, status post right breast partial mastectomy secondary to  breast cancer in February 2010, left rotator cuff surgery, right knee  surgery, status post total abdominal hysterectomy with right salpingo-  oophorectomy in 1966, and left lower extremity vein stripping.   MEDICATIONS:  1. Plavix 75 mg one p.o. daily.  2. Aspirin 325 mg one p.o. daily.  3. Metformin 1000 mg p.o. b.i.d.  4. Simvastatin 20 mg p.o. q.h.s.  5. Benicar  20 mg /HCTZ 12.5 mg one p.o. daily  6. Gabapentin 300 mg one p.o. q.i.d.  7. Trilipix, dose needs to be further clarified, once daily.  8. Carvedilol p.o. b.i.d., dose also needs to be verified.  9. Tamoxifen 20 mg one p.o. daily.   ALLERGIES/INTOLERANCE:  MORPHINE CAUSES NAUSEA AND A VOMITING.   SOCIAL HISTORY:  The patient lives alone.  She is a nonsmoker,  nondrinker.   FAMILY HISTORY:  Strongly positive for coronary artery disease.   REVIEW OF SYSTEMS:  Pertinent as mentioned above.   PHYSICAL EXAMINATION:  GENERAL:  This is an 77 year old morbidly obese  female in discomfort and mild distress.  VITAL SIGNS:  Temperature 97.7, blood pressure initially 76/33, now  98/21, heart rate 63, respirations 18, O2 saturations 96-100%.  HEENT:  Normocephalic, atraumatic.  There is no scleral icterus.  Pupils are  equally round and reactive to light.  Extraocular movements are intact.  Funduscopic  benign.  Nares are patent bilaterally.  Oropharynx is clear.  NECK:  Supple, full range of motion.  No thyromegaly, adenopathy or  jugular venous distention.  CARDIOVASCULAR:  Regular rate and rhythm.  No murmurs, gallops or rubs  appreciated.  ABDOMEN:  Positive bowel sounds, soft and nontender, nondistended,  obese.  No hepatosplenomegaly.  EXTREMITIES:  Without cyanosis, clubbing or edema.  NEUROLOGIC:  The  patient is alert and oriented x3.  Cranial nerves are intact except the  patient is mildly hard of hearing.  The patient has no gross motor  deficits.  She does have diabetic neuropathy.  RECTAL:  Examination performed by the EDP and reported as being heme-  positive.   LABORATORY STUDIES:  White blood cell count 11.3, hemoglobin 8,  hematocrit 23.1, platelets 262, neutrophils 74% lymphocytes 20%, MCV  90.8, sodium 136, potassium 5, chloride 100, carbon dioxide 22, BUN of  104, creatinine 4.67 and glucose 149.  Protime 14.8, INR 1.1 and PTT 24.  Cardiac enzymes with a CK total of 126, CK-MB 4.2, relative index 3.3,  troponin 0.04.   ASSESSMENT:  An 77 year old female is being admitted.  1. Acute gastrointestinal bleeding.  2. Hypotension secondary to #1.  3. Anemia secondary to #1.  4. Acute renal failure with chronic renal insufficiency most likely      with exacerbation from the GI bleeding.  5. Transient hypoglycemia.  6. Partially treated urinary tract infection.   PLAN:  The patient will be admitted to the ICU area.  She will be  transfused 2 units of packed red blood cells and 1 unit of packed red  blood cells will be placed on hold for now.  Serial H and H will be  checked and IV fluids have also been ordered for continued fluid  resuscitation.  The Plavix therapy and aspirin therapy will be held  secondary to the GI bleeding.  Also the metformin therapy and Benicar  combined with diuretic therapy will be held secondary to hypotension and  renal insufficiency.  The  carvedilol will also be further verified as  far as the dosing but for now will be held secondary to the patient's  hypotension.  The patient will be placed on clear liquids and sliding  scale insulin  coverage has been ordered for elevated blood sugar and the hypoglycemic  protocol will also be followed as needed.  IV Protonix q.12 hours has  been ordered and the patient will be placed in SCD's for DVT  prophylaxis.  The gastroenterologist on-call will be consulted  for  evaluation of this patient.      Della Goo, M.D.  Electronically Signed     HJ/MEDQ  D:  11/03/2008  T:  11/03/2008  Job:  [id number]

## 2010-08-11 NOTE — Op Note (Signed)
Jacqueline Zuniga, Jacqueline Zuniga                ACCOUNT NO.:  [id number]   MEDICAL RECORD NO.:  [id number]          PATIENT TYPE:  INP   LOCATION:  IC06                          FACILITY:  [facility]   PHYSICIAN:  R. Roetta Sessions, M.D. DATE OF BIRTH:  07-Oct-1925   DATE OF PROCEDURE:  11/03/2008  DATE OF DISCHARGE:                               OPERATIVE REPORT   INDICATIONS FOR PROCEDURE:  Pleasant 77 year old lady admitted to the  hospital with hypotension, melena, Hemoccult-positive stool and a  significant anemia requiring transfusion.  She has remained stable since  her admission to the ICU.  She has developed significant preretinal  azotemia and elevated creatinine over __________ 3 during her first 24  hours of hospitalization.  In fact, her potassium went up to 5.4 for  which she was given Kayexalate today.  Follow-up potassium was found to  be down at 4.8.  EGD is now being done.  Potential for therapeutic  intervention reviewed with Ms. Mcgowan previously.  Risks, benefits,  alternatives and limitations have been discussed.  Please see the  documentation in medical record.   PROCEDURE NOTE:  O2 saturation, blood pressure, pulse and respirations  monitored throughout entire procedure.  Conscious sedation with Versed 2  mg IV, Demerol 50 mg IV in divided doses.  Cetacaine spray for topical  pharyngeal anesthesia.   INSTRUMENT:  Pentax video chip system.   FINDINGS:  Examination of tubular esophagus revealed multiple 3-4 mm  ulcerations straddling the esophagogastric junction superimposed on a  Schatzki's ring.  Esophageal mucosa otherwise appeared normal.  EG  junction easily traversed with scope.   Stomach:  Gastric cavity was emptied and insufflated well with air.  Thorough examination of gastric mucosa including retroflexed proximal  stomach, esophagogastric junction demonstrated only a small hiatal  hernia and antral erosions.  There was no ulcer.  There was no blood in  the stomach.   Pylorus patent and easily traversed.  Examination of the  bulb and second portion was undertaken.  There were multiple ulcerations  of the bulbar mucosa with the largest being approximately 1 cm  straddling D1-D2.  These ulcers had a clean base and there was no  bleeding.  The patient had bulbar erosions extending well down into the  second portion of the duodenum.   Therapeutic/diagnostic maneuvers performed:  None.   The patient tolerated the procedure well was reacted in endoscopy.   IMPRESSION:  1. Distal esophageal ulcerations consistent with ulcerative reflux      esophagitis.  2. Schatzki's ring not manipulated today, otherwise unremarkable      esophagus small hiatal hernia, antral erosions.  3. Extensive bulbar ulceration with the ulcers not having any bleeding      stigmata at this time.  Extensive erosions of the bulbar and DT      mucosa as described above.   RECOMMENDATIONS:  1. Agree with b.i.d. PPI in the way of Protonix 40 mg IV q.12 h.  I      would in the next 24 hours switch her over to p.o. meds and keep  her on Protonix 40 mg orally twice daily for the next month and      drop back to once daily thereafter indefinitely.  2. Hold nonsteroidal agent and all antiplatelet therapy for the time      being.  3. Send off of stool sample for H pylori antigen testing.  4. Clear liquid diet.  5. CBC tomorrow morning.  6. Will plan to bring this nice lady back in 8-10 weeks for an      elective EGD with esophageal dilation, as well as surveillance      colonoscopy.      Jonathon Bellows, M.D.  Electronically Signed     RMR/MEDQ  D:  11/03/2008  T:  11/03/2008  Job:  [id number]   cc:   Nicoletta Dress. Colon Branch, M.D.  Fax: [phone]   Dr. Ivery Quale  [place], [place]

## 2010-08-11 NOTE — Consult Note (Signed)
NAMEYSIDRA, Jacqueline Zuniga                ACCOUNT NO.:  [id number]   MEDICAL RECORD NO.:  [id number]          PATIENT TYPE:  INP   LOCATION:  IC06                          FACILITY:  [facility]   PHYSICIAN:  R. Roetta Sessions, M.D. DATE OF BIRTH:  Jan 14, 1926   DATE OF CONSULTATION:  DATE OF DISCHARGE:                                 CONSULTATION   REASON FOR CONSULTATION:  Melena, anemia, and history of peptic ulcer  disease.   HISTORY OF PRESENT ILLNESS:  Jacqueline Zuniga is a very pleasant 77-year-  old lady from [place] who was brought to the hospital yesterday by EMS  after developing an episode of weakness and syncope.  She was found be  hypotensive.  Blood sugar was a little low at 76.  She was given glucose  and brought to the emergency department and she gave me a history of  some dark, almost tarry stools over the past couple of days.  She saw  Dr. Colon Branch in the ED last night.  She was found to have a hemoglobin of  8 and dark Hemoccult-positive stool on digital rectal exam.  She has not  had any hematemesis or hematochezia.  She was admitted to the  hospitalist service last evening and has received IV fluids and 2 units  of packed red blood cells.  Followup H and H is pending.  She has  remained entirely hemodynamically stable and clinically has not had any  further evidence of bleeding.   This lady does take aspirin and Plavix daily and takes Aleve on a  regular p.r.n. basis for arthritis.  She has a history of a bleeding  duodenal ulcer back in 2004.  Apparently, Dr. Sherin Quarry did an EGD on her  at that time and made the diagnosis.  She describes being treated for H.  pylori infection.   She also has history of undergoing a colonoscopy in 2004 and having 1  small polyp removed and she is overdue for surveillance exam as she  describes.   In addition, apparently Ms. Halterman was started on Septra recently for a  urinary tract infection.   FAMILY HISTORY:  Notable that she has a  brother and a sister both who  have had cancerous polyps removed from their colon.   Ms. Yandell does not use tobacco or alcohol.  She also describes  heartburn all time describing retrosternal burning after eating and  she also describes intermittent esophageal dysphagia to solid food.   PAST MEDICAL HISTORY:  Significant for:  1. Coronary artery disease, status post bypass graft in 2005.  She had      a PTCA with stent placement in 2005.  2. Hypertension.  3. Hyperlipidemia.  4. History of peptic ulcer disease.  5. History of colonic polyp.  6. Type 2 diabetes mellitus.   PAST SURGERIES:  1. Left rotator cuff.  2. Right knee cartilage surgery.  3. Total abdominal hysterectomy.  4. Right salpingo-oophorectomy.  5. Vein stripping left lower extremity.  6. Recently underwent surgery for right breast cancer by Dr. Cyndia Bent  down in [place].   MEDICATIONS ON ADMISSION:  1. Plavix.  2. Aspirin.  3. Metformin.  4. Simvastatin.  5. Benicar.  6. Gabapentin.  7. Trilipix.  8. Carvedilol.  9. Tamoxifen.  10.Aleve p.r.n.   ALLERGIES:  MORPHINE, NAUSEA, VOMITING.   SOCIAL HISTORY:  Patient is a widow.  [place]  No tobacco.  No alcohol.  She has 2 sons in good health  living in [place].   FAMILY HISTORY:  Significant as outlined above.  Also, positive for  coronary artery disease.   REVIEW OF SYSTEMS:  Recent chest pain and weakness as outlined above.  She denies weight loss.  She denies nausea or vomiting.  Has not any  constipation or diarrhea.  She again denies hematochezia.   PHYSICAL EXAMINATION:  Reveals an alert, conversant, pleasant lady in  ICU bed 6.  She appears to be in no acute distress.  Temp 97.3.  Pulse  16.  Respiratory rate 14.  BP 105/38.  SKIN:  Warm and dry.  There is no jaundice or cutaneous stigmata of  chronic liver disease.  HEENT:  No sclerae icterus.  Conjunctivae are pink.  Oral cavity, no   lesions.  CHEST:  Lungs are clear to auscultation.  CARDIAC:  Regular rate and rhythm without murmur, gallop, or rub.  BREAST:  Exam is deferred.  ABDOMEN:  Nondistended.  Positive bowel sounds.  Entirely soft and  nontender without appreciable mass or organomegaly.  EXTREMITIES:  Tracer lower extremity edema.   ADDITIONAL LABORATORY DATA:  CBC from this morning just returned back  white count 9.9, hemoglobin and hematocrit 9.7 and 28.3, MCV 91.5,  platelet count 193,000.  INR 1.1  From yesterday, sodium 141, potassium  4.0, chloride 105, CO2 of 28, glucose 125, BUN 31, creatinine 1.37,  bilirubin 0.6, alkaline phosphatase 31, AST 25, ALT 20, total protein  7.0 albumin 4.1, calcium 9.9.   IMPRESSION:  Jacqueline Zuniga is a very pleasant 77 year old lady admitted to  the hospital with hypotension, anemia, melena, and Hemoccult-positive  stool.  She has rapidly stabilized with intravenous fluids and packed  red blood cells.   She has a history of a bleeding duodenal ulcer and H. pylori infection  which was reportedly treated previously.   She is taking aspirin, Plavix, and Aleve.  I suspect that she has an  NSAID-induced insult to her upper gastrointestinal tract and likely has  a recurrent peptic ulcer although a small bowel or even right colon  lesion is not excluded at this time.   She has daily heartburn symptoms and describes esophageal dysphagia.   She has a history of colonic polyps and positive family history of what  sounds like carcinoma in situ in 2 first-degree relatives.   RECOMMENDATIONS:  1. Currently agree with your supportive approach.  2. Agree with proton pump inhibitor therapy.  3. I also agree that Ms. Hobart needs to go ahead and have a      diagnostic EGD in the very near future to sort things out.  To this      end, I have ordered this EGD with therapeutic intervention as      appropriate at noon today.  Risks, benefits, alternatives, and      limitations  have been reviewed.  I also told Ms. Ismael if she did      have a peptic stricture or ring which would be amenable to dilation      I would  make note of it but likely not perform dilation in a      setting of a GI bleed as potential bleeding from dilation may      confuse the clinical picture.  If she were to need esophageal      dilation, we would bring her back at a little later date as an      outpatient.  Also, at that time, __________ surveillance      colonoscopy.  We will make further recommendations in the very near      future once EGD has been performed.   I would like to thank the hospitalist team and Dr. Colon Branch for allowing  me to see this nice lady today.      Jonathon Bellows, M.D.  Electronically Signed     RMR/MEDQ  D:  11/03/2008  T:  11/03/2008  Job:  [id number]   cc:   Nicoletta Dress. Colon Branch, M.D.  Fax: [phone]   Hospitalist Team   Barry Dienes. Eloise Harman, M.D.  Fax: [phone]

## 2010-08-14 NOTE — Consult Note (Signed)
NAME:  Jacqueline Zuniga, Jacqueline Zuniga                          ACCOUNT NO.:  [id number]   MEDICAL RECORD NO.:  [id number]                   PATIENT TYPE:  INP   LOCATION:  4707                                 FACILITY:  [facility]   PHYSICIAN:  Cassell Clement, M.D.              DATE OF BIRTH:  [birth year]   DATE OF CONSULTATION:  03/27/2003  DATE OF DISCHARGE:                                   CONSULTATION   REPORT TITLE:  CARDIOLOGY CONSULTATION   CONSULTING PHYSICIAN:  Maisie Fus A. Patty Sermons, M.D.   REFERRING PHYSICIAN:  Barry Dienes. Eloise Harman, M.D.   HISTORY OF PRESENT ILLNESS:  This 77 year old woman was admitted by Dr.  Eloise Harman on March 26, 2003 with flash pulmonary edema.  She subsequently  was shown to have positive serial cardiac enzymes and a clinical  presentation consistent with a non-Q wave myocardial infarction.  Of note is  the fact that the patient had had a past history of exertional chest  pressure and had had an abnormal Cardiolite study January 24, 2003, which  showed reversible anterior wall defect.  Cardiac catheterization had been  planned but she was then noted to have a hemoglobin of 5.3 with hematocrit  of 18.9.  Catheterization was postponed and she was evaluated and worked up  as an inpatient for anemia and found to have a bleeding duodenal ulcer which  has now resolved.   Yesterday the patient was at lunch with family when she developed sudden,  severe dyspnea associated with diaphoresis and a feeling of impending doom  and near-syncope.  She had marked pallor and cold sweats; 911 was called.  She was treated aggressively with Lasix en route to the hospital, and in the  emergency room she was found to have a chest x-ray consistent with acute  pulmonary edema.  Her B natriuretic peptide is noted to be elevated as well.  She has had no chest pain since admission.   The patient is a diabetic.  She has a history of hypertension and  hypercholesterolemia.   ALLERGIES:  She is  allergic to MORPHINE.   FAMILY HISTORY:  Strongly positive for heart failure and myocardial  infarction and stroke.   SOCIAL HISTORY:  Reveals that she is married, has two children.  She does  not  use alcohol or tobacco.   REVIEW OF SYSTEMS:  Reveals that she did have a recent duodenal ulcer and is  presently on Protonix.   PHYSICAL EXAMINATION:  VITAL SIGNS:  This evening her blood pressure is  stable at 116/66, pulse is 88 and regular, respirations are normal.  Oxygen  saturation 96% on room air.  GENERAL:  She is a well-developed, well-nourished white female in no  distress.  SKIN:  Warm and dry.  NECK:  Jugular venous pressure normal.  CHEST:  Reveals a few basilar rales.  HEART:  Reveals normal first and normal second heart sounds.  There is  no  S3, no S4, no rub.  There is a soft apical systolic murmur suggesting  possible mitral regurgitation.  ABDOMEN:  Soft and nontender.  EXTREMITIES:  Show good peripheral pulses.  No phlebitis, no edema.   LABORATORY DATA:  Her electrocardiogram shows poor R-wave progression in V1  through V4, and nonspecific ST-T wave abnormalities.   IMPRESSION:  1. Non-Q wave myocardial infarction presenting with acute flash pulmonary     edema.  2. Prior Cardiolite study, October 2004, showing reversible anterior wall     defect.  3. History of duodenal ulcer, presently under treatment with rising     hemoglobin.  4. Type 2 diabetes mellitus.  5. Hypertension.  6. Hyperlipidemia.  7. Strong family history of coronary disease.   RECOMMENDATIONS:  Will obtain a 2D echocardiogram in a.m. to evaluate  systolic and diastolic function and soft apical systolic murmur.  At this  point we will cut back on her Ecotrin to 81 mg daily and add Lovenox prior  to cardiac catheterization.  Will follow the CBC closely to see if she has a  GI bleed.  We will plan cardiac catheterization for Monday, January 3, as  the cardiac catheterization laboratory is  closed on December 31.  Dr. [place]  will do the catheterization.  We will gradually increase her activity as  tolerated over the weekend.                                               Cassell Clement, M.D.    TB/MEDQ  D:  03/27/2003  T:  03/27/2003  Job:  [id number]   cc:   Barry Dienes. Eloise Harman, M.D.  [address]  [place]  [place] [address]  Fax: [phone]   Peter M. [place], M.D.  [address], [address]  [place], [place] [address]  Fax: [phone]

## 2010-08-14 NOTE — Op Note (Signed)
NAME:  Jacqueline Zuniga, Jacqueline Zuniga                          ACCOUNT NO.:  [id number]   MEDICAL RECORD NO.:  [id number]                   PATIENT TYPE:  INP   LOCATION:  2311                                 FACILITY:  [facility]   PHYSICIAN:  Kerin Perna III, M.D.           DATE OF BIRTH:  Sep 01, 1925   DATE OF PROCEDURE:  11/13/2003  DATE OF DISCHARGE:                                 OPERATIVE REPORT   PREOPERATIVE DIAGNOSES:  1. Class IV congestive heart failure.  2. Class III angina.  3. In-stent stenosis of proximal left anterior descending coronary artery.   POSTOPERATIVE DIAGNOSES:  1. Class IV congestive heart failure.  2. Class III angina.  3. In-stent stenosis of proximal left anterior descending coronary artery.   OPERATION:  Coronary artery bypass grafting x2 (left internal mammary artery  to left anterior descending coronary artery, saphenous vein graft to  diagonal).   SURGEON:  Kerin Perna, M.D.   ASSISTANT:  Eber Jones A. Eustaquio Boyden.   ANESTHESIA:  General by Maren Beach, M.D.   INDICATIONS:  The patient is a 77 year old obese female who presents with  progressive CHF.  She had previously had a proximal LAD stent placed in  January of this year.  A follow-up catheterization demonstrated in-stent  stenosis with anterior-apical hypokinesia.  She was felt to be a candidate  for surgical revascularization due to the stent failure.   I examined the patient in her hospital room and reviewed the results of the  cardiac catheterization with the patient.  I discussed the indications and  expected benefits of coronary bypass surgery for treatment of her coronary  disease.  I reviewed the alternatives to surgical therapy with the patient  as well.  I discussed with the patient the major aspects of the planned  procedure, including the choice of conduit for grafts, the location of the  surgical incisions, the use of general anesthesia and cardiopulmonary  bypass, and the  expected postoperative hospital recovery period.  I  discussed with the patient the risks to her of coronary bypass surgery,  including the risks of MI, CVA, bleeding, infection, and death.  She  understood these implications for the surgery and agreed to proceed with the  operation as planned under what I felt was an informed consent.   OPERATIVE FINDINGS:  The patient had a transesophageal echo done prior to  surgery, which showed mild to moderate regurgitation.  Her anterior wall  appeared to function better than on her most recent catheterization.  The  vein was harvested endoscopically from the right leg.  The patient has  venous insufficiency and varicosities of both lower extremities and only one  segment of vein was usable.  The mammary artery was a small vessel but had  good flow.  The patient's body habitus made exposure of the heart very  difficult.  The patient would not be a candidate for a redo  operation due to  her body habitus and the diffuse nature of her coronary disease.  The  patient was given a unit of platelets in the operating room for her  preoperative history of Plavix use for the stent.   PROCEDURE:  The patient was brought to the operating room and placed supine  on the operating room table, where general anesthesia was induced under  invasive hemodynamic monitoring.  The chest, abdomen, and legs were prepped  with Betadine and draped as a sterile field.  A sternal incision was made as  the saphenous vein was harvested from the right leg using endoscopic  technique.  The internal mammary artery was harvested as a pedicle graft  from its origin at the subclavian vessels and was a good vessel with  excellent flow.  The sternal retractor was placed using the deep blades.  The pericardium was suspended.  Through pursestrings placed in the ascending  aorta and right atrium, the patient was cannulated and placed on bypass.  The LAD and diagonal were identified for  grafting.  Cardioplegia catheters  were placed for both antegrade aortic and retrograde coronary sinus  cardioplegia.  The mammary artery and vein graft were prepared for the  distal anastomoses.  The patient cooled to 32 degrees.  The aortic  crossclamp was applied.  Eight hundred milliliters of cold blood  cardioplegia was delivered in split doses between the antegrade aortic and  retrograde coronary sinus catheters.  There was a good cardioplegic arrest,  and the septal temperature dropped to less than 12 degrees.  Topical iced  saline was used to augment myocardial preservation, and a pericardial  insulator pad was used to protect the left phrenic nerve.   The distal coronary anastomoses were then performed.  The first distal  anastomosis was to the second diagonal.  This was a 1.4 mm vessel with  proximal 90% stenosis.  A reverse saphenous vein was sewn end-to-side with  running 8-0 Prolene.  There was good flow through the graft.  The second  distal anastomosis was to the mid-LAD.  This was a 1.5 mm vessel with  proximal 99% stenosis.  The left IMA pedicle was brought through an opening  created in the left lateral pericardium and was brought down onto the LAD  and sewn end-to-side with running 8-0 Prolene.  There was a good flow  through the anastomosis after briefly unclamping the pedicle clamp on the  mammary artery.  The mammary pedicle was then reclamped with a vascular  bulldog and secured to the epicardium.  While the crossclamp was still in  place, the proximal vein anastomosis was performed to the ascending aorta  using a 4.0 mm punch and running 6-0 Prolene.  The air was vented from the  left side of the heart with a dose of warm blood retrograde cardioplegia,  and the crossclamp was removed as the proximal anastomosis was tied.   The heart resumed a spontaneous rhythm.  The patient was rewarmed to 37 degrees.  Temporary pacing wires were applied.  The lungs were  re-expanded  and the ventilator was resumed.  The patient was weaned from bypass without  difficulty.  Cardiac output and blood pressure were stable.  Cannulas were  removed and the mediastinum was irrigated with warm antibiotic irrigation.  The transesophageal echo showed good ventricular function without  significant mitral regurgitation.  The leg incision was closed with a Vicryl  closure.  Two mediastinal and a left pleural chest tube were  placed and  brought out through separate incisions.  The sternum was reapproximated with  interrupted steel wire.  The pectoralis fascia was closed with a running #1  Vicryl.  The subcutaneous and skin were closed with a running Vicryl.  Sterile dressings were applied.  Total bypass time was 75 minutes with  crossclamp time of 40 minutes.                                               Mikey Bussing, M.D.    PV/MEDQ  D:  11/14/2003  T:  11/15/2003  Job:  [id number]   cc:   Peter M. [place], M.D.  [address], [address]  [place], [place] [address]  Fax: [phone]

## 2010-08-14 NOTE — Cardiovascular Report (Signed)
NAME:  Jacqueline Zuniga, Jacqueline Zuniga                          ACCOUNT NO.:  [id number]   MEDICAL RECORD NO.:  [id number]                   PATIENT TYPE:  OIB   LOCATION:  2899                                 FACILITY:  [facility]   PHYSICIAN:  Peter M. [place], M.D.               DATE OF BIRTH:  [birth year]   DATE OF PROCEDURE:  11/07/2003  DATE OF DISCHARGE:                              CARDIAC CATHETERIZATION   INDICATIONS FOR PROCEDURE:  A 77 year old white female with history of  diabetes, hypertension, hyperlipidemia. She has had prior stenting of the  mid LAD on April 01, 2003. She now presents with recurrent symptoms of  angina, markedly abnormal stress Cardiolite study showing anterior apical  ischemia and/or scar, and ejection fraction 44%.   Access is via the right femoral artery using a standard Seldinger technique.   EQUIPMENT:  6-French 4-cm right and left Judkins catheter, 6-French pigtail  catheter, 6-French arterial sheath.   MEDICATIONS:  Local anesthesia 1% Xylocaine.   CONTRAST:  130 cc of Omnipaque.   HEMODYNAMIC DATA:  Aortic pressure 161/68 with a mean of 106. Left  ventricular pressure is 161 with EDP of 30 mmHg.   ANGIOGRAPHIC DATA:  The left coronary arises and distributes normally. The  left main coronary artery is normal.   Left anterior descending artery has subtotal narrowing in the mid vessel  just following the first diagonal branch. There is severe 99% stenosis with  diffuse end-stent restenosis and TIMI-I flow. These second diagonal which  arises from the stent has a 95% stenosis at its ostium.   The left circumflex coronary artery is without significant disease. First  marginal vessel has minor plaque up to 20% proximally.   The right coronary artery is a large dominant vessel. It has mild plaque in  the mid vessel up to 20 to 30% with diffuse calcification. There are right  to left collaterals to the LAD.   Left ventricular angiography performed in the RAO  view demonstrates mild  left ventricular enlargement with dyskinesia of the anterior apex. Ejection  fraction was estimated 40%. There is no significant mitral insufficiency.   FINAL INTERPRETATION:  1. Single vessel obstructive atherosclerotic coronary artery disease with     diffuse end-stent restenosis in the mid left     anterior descending artery.  2. Moderate left ventricular dysfunction with significant anterior apical     wall motion abnormality.   PLAN:  Would recommend LIMA graft to the LAD diagonal.                                               Peter M. Swaziland, M.D.    PMJ/MEDQ  D:  11/07/2003  T:  11/08/2003  Job:  [id number]   cc:   Barry Dienes. Eloise Harman, M.D.  [address]  [place]  [place] [address]  Fax: [phone]

## 2010-08-14 NOTE — H&P (Signed)
NAME:  Jacqueline Zuniga, Jacqueline Zuniga                          ACCOUNT NO.:  [id number]   MEDICAL RECORD NO.:  [id number]                   PATIENT TYPE:  INP   LOCATION:  1826                                 FACILITY:  [facility]   PHYSICIAN:  Barry Dienes. Eloise Harman, M.D.            DATE OF BIRTH:  [birth year]   DATE OF ADMISSION:  03/26/2003  DATE OF DISCHARGE:                                HISTORY & PHYSICAL   CHIEF COMPLAINT:  Shortness of breath.   HISTORY OF PRESENT ILLNESS:  The patient is a 77 year old white female with  multiple medical problems.  She felt fine until approximately 11 a.m. this  morning when she developed rapidly worsening dyspnea while on the toilet  associated with chest tightness. She called 911 and was brought to the [facility] for evaluation. In the emergency room, her presentation  was felt most consistent with congestive heart failure and she was treated  with Lasix 80 mg IV, oxygen, and nitroglycerin IV. Currently she is feeling  much better. She continues to have a dry cough but no longer has any dyspnea  at rest or chest discomfort.  She denies recent fever, orthopnea or  paroxysmal nocturnal dyspnea.   PAST MEDICAL HISTORY:  Most significant for diabetes mellitus, type 2 for  approximately 30 years, hypertension, hyperlipidemia, remote history of  endometrial carcinoma, carotid bruits with a November 2004 carotid  ultrasound showing no evidence of ICA stenosis, anemia in November 2004  leading to a hospital admission from November 3 to February 01, 2003 and a  EGD showing a partially healed duodenal ulcer.  During that admission, she  was transfused 4 units of packed red blood cells.  A March 2004 colonoscopy  showed only polyps.  Earlier in December this year, she tripped and fell  sustaining a contusion on her forehead with a head CT scan showing no  evidence of intracranial bleed.  She has no history of a heart attack,  however, an October 2004 adenosine  Cardiolite test showed a 56% left  ventricular ejection fraction with a large anterior wall perfusion defect  that was partially reversible.  She was to have a cardiac catheterization in  early November which was delayed when precatheterization labs showed  significant anemia with the subsequent workup showing a duodenal ulcer. The  plan had been to schedule an elective cardiac catheterization after the  Christmas holiday.   MEDICATIONS PRIOR TO ADMISSION:  1. Glyburide 5 mg p.o. q.d.  2. Actos 30 mg p.o. q.d. that she has been on for several years.  3. Metformin 500 mg 2 tabs p.o. b.i.d.  4. Neurontin 300 mg p.o. b.i.d.  5. Diovan 160 mg p.o. q.d.  6. Nu-Iron 150, 1 tablet p.o. b.i.d.  7. Vitamin C 500 mg p.o. b.i.d.  8. Protonix 40 mg p.o. q.d.  9. Lopid 600 mg p.o. b.i.d.   ALLERGIES:  MORPHINE SULFATE has been  associated with severe nausea.   PAST SURGICAL HISTORY:  1. 1966 total abdominal hysterectomy with right salpingo-oophorectomy.  2. 2002 left rotator cuff repair.  3. Remote history of left vein stripping.  4. March 2004 colonoscopy.  5. November 2004 EGD.   FAMILY HISTORY:  Father died at age 14 of coronary artery disease, mother  died at age 36 of congestive heart failure, brother died at age 54 of  myocardial infarction.  Brother died at age 77 of a stroke, he also had a  history of coronary artery disease.  A sister died at age 92 of congestive  heart failure.   SOCIAL HISTORY:  She has been a widow since 45. She is retired and has two  sons that live in [place]. She has no history of alcohol or tobacco  abuse.  She has requested a full code status.   REVIEW OF SYMPTOMS:  She denies recent fever, chills, headache.  She has had  some diarrhea since starting Protonix and iron.  Her stools have been black  since that time.  She had marked shortness of breath with chest tightness  that has dramatically improved since presentation to the emergency room.  She  has chronic arthralgias in her right shoulder and both knees. Although  she had a fall in early December, generally her gait is quite steady and she  does not require a cane or a walker.   PHYSICAL EXAMINATION:  VITAL SIGNS:  Initially she had a blood pressure of  175/76, pulse 122, respirations 22, pulse oxygen saturation 91%.  Currently  she has a blood pressure of 138/74, pulse 82, respirations 20, temperature  97.8, pulse oxygen saturation 98% on 2 liters per minute nasal cannula  oxygen.  GENERAL:  She is an elderly white female who was in no apparent distress  while sitting at 30 degree elevation of the head of bed on nasal cannula  oxygen.  HEENT:  Significant for an old contusion on the mid forehead.  NECK:  Without jugular venous distention or carotid bruit.  CHEST:  Had faint bibasilar crackles.  HEART:  Had a regular rate and rhythm.  S1 and S2 were present without  murmur, gallop or rub.  ABDOMEN:  Had normal bowel sounds with no hepatosplenomegaly or tenderness.  EXTREMITIES:  Without cyanosis, clubbing or edema. The pedal pulses were  normal.  NEUROLOGIC:  Nonfocal.   LABORATORY DATA:  A February 18, 2003 hemoglobin was 9.4, hematocrit 29.6  with a hemoglobin A1C level of 5.4%.  Today's laboratory test include a  white blood cell count of 12.7, hemoglobin 11, hematocrit 35, platelet count  545, serum sodium 136, potassium 4.3, chloride 104, CO2 22, BUN 18,  creatinine 1.0.  Glucose 518, serum albumin 3.8, CK 66, troponin I 0.05.  Urinalysis was nitrite negative.  Chest x-ray showed bilateral scattered  infiltrates most consistent with pulmonary edema.   EKG showed:  1. Sinus tachycardia.  2. Septal myocardial infarction, age undetermined.  3. Incomplete right bundle branch block.  4. Nonspecific T wave abnormalities that were unchanged versus an old EKG     from May 07, 2002.   IMPRESSION/PLAN: 1. Congestive heart failure.  This is much improved with initial  treatment     in the emergency room. The rapid onset of her symptoms are most     suggestive of ischemia as an etiology.  I plan to check serial cardiac     isoenzymes to rule out a non Q wave  myocardial infarction and transition     her from IV nitroglycerin to p.o. nitrates.  She will be continued on     Lasix with her Diovan and low dose beta blocker treatment will be     initiated.  2. Diabetes mellitus, type 2.  Uncontrolled at present. Due to acute     congestive heart failure, Glucophage will be held at this time and she     will be treated with sliding scale insulin as needed.  3. Anemia.  Resolved with iron treatment for her gastrointestinal bleed from     a duodenal ulcer. I will add a helicobacter pylorus serology test to     studies being done during this hospitalization to rule out H. pylorus     infection.                                                Barry Dienes Eloise Harman, M.D.    DGP/MEDQ  D:  03/26/2003  T:  03/26/2003  Job:  [id number]   cc:   Geoffry Paradise, M.D.  [address]  [place]  [place] [address]  Fax: [phone]   Peter M. [place], M.D.  [address], [address]  [place], [place] [address]  Fax: [phone]   Teena Irani. Arlyce Dice, M.D.  [address]  [place]  [place] [address]  Fax: [phone]

## 2010-08-14 NOTE — Discharge Summary (Signed)
NAME:  Jacqueline Zuniga, LOGUE NO.:  [id number]   MEDICAL RECORD NO.:  [id number]                   PATIENT TYPE:  INP   LOCATION:  2039                                 FACILITY:  [facility]   PHYSICIAN:  Kerin Perna, M.D.               DATE OF BIRTH:  1926-03-09   DATE OF ADMISSION:  11/07/2003  DATE OF DISCHARGE:  11/19/2003                                 DISCHARGE SUMMARY   The patient is tentatively scheduled to transfer to [facility]  in [place], [place].   HISTORY OF PRESENT ILLNESS:  Jacqueline Zuniga is a 77 year old female who has a  history of coronary artery disease, chronic anemia, diabetes mellitus,  hypertension and dyslipidemia.  She had presented in January of 2005 with  acute pulmonary edema and unstable angina.  At that time, she was found to  have a complex mid LAD stenosis.  This was successfully stented using a 3.0  x 32 mm Taxus drug-eluding stent.  She also had a moderate diagonal branch  arising out of the lesion which was successfully dilated through the stent.  The patient also has a known history of severe anemia and has required  transfusion in the past.  She has a history of GI bleeding in the past, but  apparently recent endoscopy by Dr. Sherin Quarry showed no significant pathology.  On current presentation, she was noted to have increasing fatigue.  She  developed mild pressure in the center of her chest when she does a lot of  exertion.  To follow up on these symptoms, she underwent an adenosine  Cardiolite study on November 04, 2003.  This demonstrated marked ischemia  involving the anterior wall and apex.  The ejection fraction was decreased  at 45%.  Based on these findings, it was recommended she undergo a repeat  coronary angiography as she was high risk for restenosis.  She was admitted  this hospitalization for the procedure.   PAST MEDICAL HISTORY:  1. Atherosclerotic coronary artery disease, status post  stenting of the mid     LAD.  2. Diabetes mellitus, type 2.  3. Chronic anemia.  4. History of GI bleed.  5. Hypertension.  6. Dyslipidemia.   PAST SURGICAL HISTORY:  1. The patient has had a prior hysterectomy.  2. She has also had previous left rotator cuff repair.   ALLERGIES:  MORPHINE.   MEDICATIONS ON ADMISSION:  1. Metformin 1000 mg b.i.d.  2. Actos 30 mg daily.  3. Glyburide 4 mg daily.  4. Diovan 160 mg daily.  5. Toprol XL 50 mg daily.  6. Gemfibrozil 600 mg b.i.d.  7. Aspirin 325 mg daily.  8. Iron sulfate 150 mg t.i.d.   She had previously been on Plavix, but her last dose was November 04, 2003, and  it was on hold at the time of admission.   SOCIAL HISTORY:  Please see  the history and physical done at the time of  admission.   FAMILY HISTORY:  Please see the history and physical done at the time of  admission.   REVIEW OF SYSTEMS:  Please see the history and physical done at the time of  admission.   PHYSICAL EXAMINATION:  Please see the history and physical done at the time  of admission.   HOSPITAL COURSE:  The patient was admitted for cardiac catheterization.  Findings included as followed:  1.  Single-vessel obstructive  atherosclerotic coronary artery disease with diffuse end-stent restenosis in  the mid left anterior descending.  2.  Moderate left ventricular dysfunction  with significant anterior apical wall motion abnormality.  It was Dr.  Elvis Coil opinion that she would best be served with surgical  revascularization.  A cardiac surgical consultation was obtained with Kerin Perna, M.D., who evaluated the patient and studies and felt as though  she would be a candidate for revascularization following a 2-D  echocardiogram to further evaluate question mitral regurgitation and also  the impaired left ventricular function noted on ventriculogram as there was  significant anterior wall abnormality.  The echocardiogram was done and was  felt to be  acceptable for proceeding with the surgery after review.  Findings included trivial mitral regurgitation with moderate left  ventricular dysfunction and ejection fraction estimated at 35-40% with  anteroapical akinesis and moderate septal hypokinesis.  The studies were  reviewed and the surgery was subsequently planned.  The patient was  stabilized from a medical viewpoint.  On November 13, 2003, she was felt to be  acceptable for proceeding and was taken to the operating room.   PROCEDURES:  Coronary artery bypass grafting x 2.  The following grafts were  placed:  1.  Left internal mammary artery to the LAD.  2.  Saphenous vein  graft to the diagonal.  The cross clamp was 38 minutes.  The pump time was  70 minutes.  The patient tolerated the procedure well.  Intraoperative  notations included the anterior wall moved better after the graft was  placed.  Additionally, the patient did require platelet transfusion for  coagulopathy.   POSTOPERATIVE HOSPITAL COURSE:  The patient has done well.  She has remained  hemodynamically stable.  Initially she did require some pressor support, but  this was weaned without difficulty.  She was extubated from the ventilator  without difficulty.  She does have a postoperative anemia.  She has been  restarted back on her iron.  Her creatinine has shown a slight bump from  preoperative level, but is currently stable at 1.5.  The patient's ACE  inhibitor has been placed on hold.  The patient's diabetes has been well  controlled with resumption of oral regimen with the exception of her  Glucophage.  She has required Lantus at night for stable capillary blood  glucose control.  Her incisions are all healing well.  She did have an  episode of rapid atrial fibrillation on November 17, 2003, requiring chemical  cardioversion and she has been placed on amiodarone initially intravenously, but has been converted to oral.  She has required a moderate diuresis, but  has  shown a good and gradual improvement regarding her edema.  It is noted  that the patient has no significant local support system to help her at  discharge and it is felt that she would benefit from a nursing facility stay  for what is estimated at this time to be  10 days to continue with her  postoperative rehabilitation.  Currently she is felt to be stable at this  time for tentative discharge to the nursing facility on November 19, 2003,  pending morning round reevaluation.   MEDICATIONS ON DISCHARGE:  1. Multivitamins one daily.  2. Aspirin 325 mg daily.  3. Toprol XL 25 mg daily.  4. Lopid 600 mg twice daily.  5. Amiodarone 400 mg twice daily for one week and then once daily.  6. Actos 30 mg daily.  7. Niferex 150 mg three times daily.  8. Neurontin 300 mg three times daily.  9. Glyburide 5 mg daily.  10.      Plavix 75 mg daily.  11.      Lantus insulin 25 units at bedtime as needed  12.      Protonix 40 mg daily.  13.      Lasix 40 mg daily.  14.      K-Dur 20 mEq daily.   DIET:  She is instructed to follow a carbohydrate-modified, medium calorie  diet.   WOUND CARE:  The patient may shower and clean her incisions gently with soap  and water.  She is instructed to call the office if wound problems occur.   FOLLOWUP:  Followup should include Peter M. [place]il[place], M.D., in two weeks.  She  needs to call the office to arrange.  The office number is [phone].  Additionally, she will have followup with Dr. Donata Clay in three weeks.  The  office will call her with this.   ACTIVITY:  Activities include no lifting more than 10 pounds and no driving  until further evaluation by her surgeon in followup.  She is encouraged to  gradually increase her walking daily as tolerated.   FINAL DIAGNOSES:  1. Severe single-vessel coronary artery disease as described, now status     post surgical revascularization.  2. Coronary artery bypass grafting x 2.   OTHER DIAGNOSES:  1. Previous  stenting of left anterior descending.  2. Diabetes mellitus.  3. Chronic anemia.  4. History of gastrointestinal bleed.  5. Hypertension.  6. Dyslipidemia.  7. Previous hysterectomy.  8. Previous rotator cuff repair, left side.  9. Postoperative anemia.  10.      Postoperative atrial fibrillation.   CONDITION ON DISCHARGE:  Stable and improving.      Rowe Clack, P.A.-C.                    Kerin Perna, M.D.    Sherryll Burger  D:  11/18/2003  T:  11/18/2003  Job:  [id number]   cc:   Tasia Catchings, M.D.  [address]  [place]  [place] [address]  Fax: [phone]   Barry Dienes. Eloise Harman, M.D.  [address]  [place]  [place] [address]  Fax: [phone]

## 2010-08-14 NOTE — Consult Note (Signed)
NAME:  Jacqueline Zuniga, Jacqueline Zuniga                          ACCOUNT NO.:  [id number]   MEDICAL RECORD NO.:  [id number]                   PATIENT TYPE:  OIB   LOCATION:  3739                                 FACILITY:  [facility]   PHYSICIAN:  Peter M. [place], M.D.               DATE OF BIRTH:  17-Oct-1925   DATE OF CONSULTATION:  11/07/2003  DATE OF DISCHARGE:                                   CONSULTATION   PRIMARY CARE PHYSICIAN:  Barry Dienes. Eloise Harman, M.D.   REASON FOR CONSULTATION:  Severe recurrent coronary artery disease with  class III angina.   CHIEF COMPLAINT:  Shortness of breath.   HISTORY OF PRESENT ILLNESS:  I was asked to evaluate this 77 year old white  female for potential surgical revascularization for recently diagnosed  severe coronary artery disease.  The patient presented in late December of  last year with an acute MI which she was told was a mild MI.  She underwent  cardiac catheterization by Dr. Peter M. [place]and which demonstrated a proximal  LAD stenosis which was treated with a drug-eluding stent.  She states she  started cardiac rehabilitation later this year and has had dyspnea on  exertion and decrease in exercise tolerance but no specific chest pain.  Along with this, she was found to be anemic and endoscopy by Dr. Tasia Catchings showed no active pathology.   Because of her shortness of breath, a repeat stress test was performed  recently which showed anterior/apical scar/ischemia.  Because of the concern  of restenosis of the stent, a cardiac catheterization was performed today.  This shows a 99% end-stent stenosis of the LAD, moderate disease of the OM1,  and stenosis of the diagonal branch to the LAD.  She has apical anterior  akinesia and questionable mitral regurgitation with ejection fraction of  35%.  Her left ventricular end-diastolic pressure is 30 mmHg.   Based on her anatomic findings at catheterization, surgical  revascularization was recommended.   PAST  MEDICAL HISTORY:  1. Type 2 diabetes.  2. Hypertension.  3. Chronic anemia.  4. History of gastrointestinal bleed.  5. Chronic Plavix therapy since January for her LAD stent.  6. Hyperlipidemia.  7. Varicose veins bilaterally.   CURRENT MEDICATIONS:  Metformin, Actos, glyburide, Diovan, Lopid, aspirin,  Plavix, Toprol XL, Protonix, and Niferex.   ALLERGIES:  MORPHINE causes nausea.   SOCIAL HISTORY:  She lives alone and does not have any immediate family  members to help her after surgery.  Her husband died of lung cancer.  The  patient is a retired [employer] operational officer and does not smoke or use alcohol.   REVIEW OF SYMPTOMS:  No weight loss or fever.  ENT:  Negative for dental  complaints or difficulty swallowing.  PULMONARY:  Negative for pneumonia or  thoracic trauma.  CARDIAC:  Positive for MI in December 2004 and LAD stent  in January 2005 with class  III CHF currently and moderate LV dysfunction on  catheterization.  GI:  Positive for her remote GI bleed.  No recent  symptoms.  UROLOGIC:  Negative for kidney stones or hematuria.  VASCULAR:  Positive for varicosities of the lower extremities.  Negative for  claudication or TIA.  ENDOCRINE:  Positive for diabetes.  Negative for  thyroid disease.  HEMATOLOGIC:  Negative for bleeding disorder or blood  transfusion.  Review of systems otherwise unremarkable.   PHYSICAL EXAMINATION:  VITAL SIGNS:  The patient is 5 foot 2 inches.  Weighs  190 pounds.  Blood pressure is 134/82, heart rate 70 and regular.  She is  afebrile.  GENERAL:  That of an elderly, pleasant, obese white female in her hospital  room following cardiac catheterization in no distress.  HEENT:  Normocephalic.  Pharynx clear.  Dentition adequate.  NECK:  Without masses, JVD, or bruit.  LYMPH NODES:  Reveal no palpable supraclavicular adenopathy.  LUNGS:  Clear.  CHEST:  There is no thoracic deformity.  CARDIOVASCULAR:  Regular rhythm without murmur or gallop.  ABDOMEN:   Obese, soft, nontender.  EXTREMITIES:  No clubbing, cyanosis, or edema.  PULSES:  Peripheral pulses are 2+.  RECTAL:  Deferred.  SKIN:  Without rash.  NEUROLOGICAL:  Intact without focal deficit.   LABORATORY DATA:  I reviewed the coronary arteriogram and she has a long end-  stent stenosis of the LAD.  The anterior-apical wall is akinetic and LV  function is reduced.  She has a moderate stenosis of the circumflex marginal  and diagonal.  Chest x-ray is pending.   IMPRESSION:  The patient has significant coronary disease and significant  shortness of breath especially with exertion.  Her current hematocrit is  31%.  Her left anterior descending artery is a graftable vessel; however, I  am concerned that the regional dysfunction in the left anterior descending  artery distribution may be due to a Fick scar and the benefits of surgical  revascularization may not be very great.  I have recommended that a 2-D  echocardiogram to evaluate early function and question of a mitral  regurgitation, although on her previous echocardiogram earlier this year she  had no significant mitral regurgitation.  However, left ventricular function  looks different from the ventriculogram in January and I also would consider  a viability study.  It is felt that if her anterior wall is viable based on  her previous studies or a viability study at this time, then surgery  revascularization could be planned next week after her Plavix wears off.  Thank you for the consultation.     Mikey Bussing, M.D.                 Peter M. [place], M.D.    PV/MEDQ  D:  11/07/2003  T:  11/08/2003  Job:  [id number]

## 2010-08-14 NOTE — Cardiovascular Report (Signed)
NAME:  Jacqueline Zuniga, Jacqueline Zuniga                          ACCOUNT NO.:  [id number]   MEDICAL RECORD NO.:  [id number]                   PATIENT TYPE:  INP   LOCATION:  6523                                 FACILITY:  [facility]   PHYSICIAN:  Peter M. [place], M.D.               DATE OF BIRTH:  Apr 01, 1925   DATE OF PROCEDURE:  04/01/2003  DATE OF DISCHARGE:                              CARDIAC CATHETERIZATION   PROCEDURES PERFORMED:  1. Left heart catheterization.  2. Coronary angiography.  3. Left ventricular angiography.  4. Stenting of the mid left anterior descending.   CARDIOLOGIST:  Peter M. [place], M.D.   INDICATIONS FOR PROCEDURE:  The patient is a 77 year old white female who  presented with acute congestive heart failure associated with non-Q wave  myocardial infarction.  She has multiple cardiac risk factors including  history of diabetes, hypertension, hyperlipidemia and family history of  coronary disease.  The patient has a previous stress Cardiolite, which  showed evidence of anterior wall ischemia; however, cardiac catheterization  had to be postponed due to severe anemia with a hemoglobin of 5.3.  She has  since been transfused and diagnosed with duodenal ulcer and has been on  appropriate therapy for the past two months.   ACCESS:  Access is via the right femoral artery using the standard Seldinger  technique.   EQUIPMENT:  Six French 4 cm right and left Judkins catheters.  Six French  pigtail catheter.  Six French arterial sheath.  Seven French arterial  sheath.  Seven [place] JL-4 guide and a 0.014 Hi-Torque floppy wire times  two.  A 3.0 x 30 mm Maverick balloon.  A 3.0 x 15 mm Quantum balloon and a  2.5 x 9 mm Maverick balloon.  A 3.0 x 32 mm TAXUS drug-eluting stent.   CONTRAST:  Three-hundred-fifteen milliliters Omnipaque.   HEMODYNAMIC DATA:  Aortic pressure is 157/76 with a mean of 110.  Left  ventricular pressure is 169 with an EDP of 38 mmHg.   ANGIOGRAPHIC DATA:   Left Coronary Artery:  The left coronary artery rises  and distributes normally.   Left Main Coronary Artery:  The left main coronary artery is relatively  short without significant disease.   Left Anterior Descending Artery:  The left anterior descending artery  demonstrates a long segmental stenosis immediately after the takeoff of the  first diagonal branch.  This lesion is most severe immediately after the  first diagonal up to 90%.  There is then a long segment of disease, which  extends past the second diagonal.  The second diagonal ostium is relatively  free of disease.   Left Circumflex Coronary Artery:  The left circumflex coronary artery is  without significant disease.   Right Coronary Artery:  The right coronary artery is a large dominant vessel  and has moderate irregularities less than or equal to 20%.   VENTRICULOGRAPHIC DATA:  Left Ventricular  Angiography:  Left ventricular  angiography performed in the RAO view demonstrates normal left ventricular  size with minimal anterior wall hypokinesia.  Ejection fraction is estimated  at 50-55%.  There is no significant mitral insufficiency.   INTERVENTIONAL PROCEDURE:  The procedure was stenting of the mid LAD.  In  order to attempt to protect the diagonal side branch we wired both the LAD  and second diagonal branches without difficulty.  We then predilated the LAD  lesion using the Maverick 3.0 x 30 mm balloon initially at six atmospheres  and then nine atmospheres.  We then placed a 3.0 x 32 mm TAXUS drug-eluting  stent in the LAD withdrawing the wire from the diagonal.  The TAXUS was  dilated to nine atmospheres and then at 12 atmospheres with the stent  balloon.  We then exchanged for a 3.0 x 15 mm Quantum balloon and used this  to dilate the proximal portion of the stent up to 16 and then 18  atmospheres.  This yielded excellent angiographic result within the LAD.  There was some narrowing of the diagonal ostium at  this point.  We recrossed  this with the wire and the ostium of the second diagonal was dilated using  2.5 x 9 mm Maverick up to six atmospheres.  This yielded excellent result  with excellent flow down the diagonal and no significant ostial stenosis.   FINAL INTERPRETATION:  1. Single-vessel obstructive atherosclerotic coronary artery disease with a     complex mid left anterior descending stenosis.  2. Overall normal left ventricular function.  3. Successful stenting of the mid left anterior descending.   ADDENDUM:  Medications used during the procedure included heparin, a total  of 6,300 units, Integrilin double bolus at 180 mcg/kg followed by continuous  infusion of 2 mcg/kg/minute, nitroglycerin 200 mcg intracoronary times one  and Plavix 300 mg p.o.                                               Peter M. [place], M.D.    PMJ/MEDQ  D:  04/01/2003  T:  04/01/2003  Job:  [id number]   cc:   Barry Dienes. Eloise Harman, M.D.  [address]  [place]  [place] [address]  Fax: [phone]   Catheterization Laboratory

## 2010-08-14 NOTE — Discharge Summary (Signed)
NAME:  Jacqueline Zuniga, Jacqueline Zuniga                          ACCOUNT NO.:  [id number]   MEDICAL RECORD NO.:  [id number]                   PATIENT TYPE:  INP   LOCATION:  6523                                 FACILITY:  [facility]   PHYSICIAN:  Barry Dienes. Eloise Harman, M.D.            DATE OF BIRTH:  10/16/1925   DATE OF ADMISSION:  03/26/2003  DATE OF DISCHARGE:  04/03/2003                                 DISCHARGE SUMMARY   PERTINENT FINDINGS:  The patient is a 77 year old white female with multiple  medical problems.  She felt fine until approximately 11 a.m. on the morning  of admission, when she developed rapidly worsening dyspnea while on the  toilet associated with chest tightness.  She called 911 and was brought to  the [facility] Emergency Room for evaluation.  In the emergency room, her  presentation was felt most consistent with congestive heart failure and she  was treated with IV Lasix, IV nitroglycerin, and oxygen.  With this  treatment, she felt much better.  She continued to have a dry cough but no  longer had dyspnea at rest or chest discomfort.  She denied recent fever,  orthopnea, or paroxysmal nocturnal dyspnea.   PAST MEDICAL HISTORY:  Past medical history is significant for:  1. Diabetes mellitus, type 2, for approximately 30 years.  2. Hypertension.  3. Hyperlipidemia.  4. A remote history of endometrial carcinoma.  5. Carotid bruits with a November 2004 ultrasound showing no evidence of ICA     stenosis.  6. Anemia in November 2004 leading to hospital admission for workup and an     EGD showing a partially healed duodenal ulcer.  She was also treated with     4 units of packed red blood cells.  7. In October 2004, she had an adenosine Cardiolite test that showed a 56%     left ventricular ejection fraction with a large anterior wall perfusion     defect that was partially reversible.  Plans for cardiac catheterization     were delayed to evaluate and treat her severe anemia.   INITIAL PHYSICAL EXAM:  VITAL SIGNS:  Blood pressure 175/76, pulse 122,  respirations 22, pulse oxygen saturation 91%.  GENERAL:  In general, she is an elderly white female who was in no apparent  distress while sitting at 30-degree elevation of the head of the bed on  nasal cannula oxygen.  HEENT:  Exam was significant for an old contusion on the mid-forehead.  NECK:  Neck was supple without jugular venous distention or carotid bruit.  CHEST:  Chest had faint bibasilar crackles.  HEART:  Heart had a regular rate and rhythm.  S1 and S2 were present without  murmur, gallop, or rub.  ABDOMEN:  Abdomen was benign.  EXTREMITIES:  Extremities were without cyanosis, clubbing, or edema.  NEUROLOGICAL:  Exam was nonfocal.   LABORATORY STUDIES:  Hemoglobin 11, white blood cell count  12.7, hematocrit  35, platelet count 545,000.  Serum sodium 136, potassium 4.3, chloride 104,  CO2 22, BUN 18, creatinine 1.0, glucose 518.  Troponin I 0.05, CK 66.   Chest x-ray showed bilateral scattered infiltrates most consistent with  pulmonary edema.   EKG shows:  #1 - Sinus tachycardia; #2 - septal myocardial infarction, age  undetermined; #3 - incomplete right bundle branch block; #4 - nonspecific T  wave abnormalities that were unchanged versus an old EKG from May 07, 2002.   HOSPITAL COURSE:  The patient was admitted to a telemetry bed and treated  with high-dose IV diuretics, IV nitroglycerin, and oxygen.  Serial cardiac  isoenzymes showed that she had sustained a non-Q-wave myocardial infarction  with a peak CPK level of 807 with MB 9.1 and troponin I of 0.97.  Lovenox  was added to her regimen and she was stabilized over the weekend.  She had a  cardiac catheterization done that showed a left ventricular ejection  fraction of 50-55%, a normal left main, left anterior descending artery with  90% long lesion after the first diagonal branch, circumflex normal and right  coronary artery with less  than 20% stenosis.  She was treated with stenting  of the mid LAD lesion and one of the stents did contain TAXUS.  The  procedure was successful with excellent flow.  She was treated with  Integrilin for 18 hours and aspirin and Plavix were added to her regimen.  A  transesophageal echocardiogram was done during her hospitalization with  results pending at the time of dictation.  In addition, she had a  transfusion of 2 units of packed red blood cells due to a post-cardiac-  catheterization hematocrit of 24.  Most recent laboratory tests included a  CBC with a white blood cell count of 7.6, hemoglobin 9.7, hematocrit 28,  platelet count 283,000; BUN and creatinine of 34 and 1.1, respectively.  Most recent capillary blood glucose levels were 191 and 167.   PROCEDURES:  1. Transesophageal echocardiogram.  2. Cardiac catheterization with stenting procedure.  3. Transfusion of 2 units of packed red blood cells.   COMPLICATIONS:  None.   CONDITION ON DISCHARGE:  She feels fine with no fatigue, shortness of  breath, or chest discomfort.  Her chest is clear and she has no peripheral  edema.  She has been eating well with no nausea.  Of note, a Helicobacter  pylori test was run during her hospitalization and showed a level of 6.82  (normal less than 1.1).   DISCHARGE DIAGNOSES:  1. Non-Q-wave myocardial infarction.  2. Single-vessel coronary artery disease, status post stent procedure in the     left anterior descending.  3. Diabetes mellitus, type 2, controlled.  4. Anemia.  5. Duodenal ulcer with Helicobacter pylori positivity.  6. Endometrial carcinoma.  7. Hypertension.  8. Hyperlipidemia.  9. Diabetic peripheral neuropathy.   DISCHARGE MEDICATIONS:  1. Diovan 160 mg p.o. daily.  2. Glyburide 5 mg p.o. daily.  3. Actos 30 mg p.o. daily.  4. Metformin 500 mg 1 tab p.o. twice daily for 5 days, then 2 tabs p.o.     twice daily. 5. Neurontin 300 mg p.o. b.i.d.  6. Nu-Iron 150 one  tablet twice daily.  7. Vitamin C 500 mg twice daily.  8. Protonix 40 mg once daily.  9. Lopid 600 mg twice daily.  10.      Ecotrin 325 mg daily.  11.      Plavix  75 mg daily.  12.      Toprol-XL 25 mg daily.  13.      Lasix 20 mg daily.  14.      Potassium chloride 20 mEq daily.  15.      In addition, she will be given a Prevpac upon return visit to the     office to eradicate her H. pylori infection.   FOLLOWUP PLANS:  She will be seen by Dr. Peter M. [place] at [facility] in 2 to 3 weeks following discharge.  She will also be seen by  Dr. Barry Dienes. [place] at [facility] within 1 week of  discharge.                                                Barry Dienes Eloise Harman, M.D.    DGP/MEDQ  D:  04/03/2003  T:  04/03/2003  Job:  [id number]

## 2010-08-14 NOTE — Discharge Summary (Signed)
NAME:  Jacqueline, Zuniga                          ACCOUNT NO.:  [id number]   MEDICAL RECORD NO.:  [id number]                   PATIENT TYPE:  INP   LOCATION:  4705                                 FACILITY:  [facility]   PHYSICIAN:  Geoffry Paradise, M.D.               DATE OF BIRTH:  May 17, 1925   DATE OF ADMISSION:  01/30/2003  DATE OF DISCHARGE:  02/01/2003                                 DISCHARGE SUMMARY   DISCHARGE DIAGNOSES:  1. Profound anemia secondary to subacute gastrointestinal bleed.  2. Duodenal ulcer, no active bleeding.  3. Arteriosclerotic coronary artery disease suspect, with positive     Cardiolite, cath pending.  4. Diabetes mellitus type 2, stable.  5. Essential hypertension.  6. Hyperlipidemia.   HISTORY OF PRESENT ILLNESS:  [name] is a 77 year old white female with  diabetes mellitus type 2, essential hypertension, hyperlipidemia, presenting  with approximately 12 weeks of dyspnea upon exertion and profound fatigue.  This resulted in the initiation of a cardiac work-up which culminated in a  positive Cardiolite study that was clearly changed from one 2 years ago.  Cardiac catheterization was planned, but upon pre-catheterization work-up,  she was noted to have a hemoglobin of 5.3 and admitted with  significant/profound anemia and questionable unstable angina.  She did have  some accompanying epigastric symptoms for approximately 6-8 weeks.  The patient was admitted for transfusion and stabilization.   For details of this, please see the dictated history and physical exam  January 30, 2003.   DATA:  Chemistries:  Sodium 136, potassium 4.2, chloride 109, CO2 20,  glucose 109, BUN 25, creatinine 1.0, calcium 9.3, protein 7.4, albumin 3.6,  SGOT 18, SGPT 13, alkaline phosphatase 96, bilirubin 0.3.  INR 1, PTT 32.  CBC:  Hemoglobin 5.1, hematocrit 17.5, MCV 62.7, platelet count 898,000, red  blood cell count 6.3.  Blood type A positive, antibody screen negative.  Repeat CBC following 2 units of packed red blood cells:  Hemoglobin 7.5,  hematocrit 22.4.  Repeat following an additional 2 units red blood cells,  total of 4 at this point:  Hemoglobin 8.9, hematocrit 28.7, white blood cell  count 6.1.   HOSPITAL COURSE:  The patient was admitted, received 2 units of packed red  blood cells (Lasix between each unit), rechecked hemoglobins still  suboptimal.  Subsequently, the patient received another 2 units with  excellent stabilization and discharge hemoglobin at approximately 9.0 and  total resolution of symptomatology.  She was having absolutely no dyspnea  upon exertion, chest pain or shortness of breath.  She denies any abdominal  complaints, nausea or vomiting and she has had no melanotic stools.   She did undergo upper GI endoscopy by Dr. Arlyce Dice and this revealed a large,  active duodenal ulcer with no evidence of active bleeding, this pending for  H. pylori, recommendations being iron therapy and continuation of proton  pump inhibitor.  Cardiology did see patient in consultation, with results noted.  Cardiac  catheterization was felt to be elective at this point, and given the fact  aspirin and blood thinners need to be avoided for several weeks, cath will  be scheduled once the ulcer has been healed and hemoglobin normalized.   Prior to discharge, the patient is eating well, has total resolution of  symptoms and is back to baseline.  She will be discharged at this point for  further care as an outpatient.   DISCHARGE DIET:  Resumption of her home diet, fat modified, no concentrated  sweets.   DISCHARGE MEDICATIONS:  Home medications to include:  1. Diovan 160 p.o. daily.  2. Neurontin 300 mg b.i.d.  3. Glyburide 5 daily.  4. Actos 30 mg daily.  5. Metformin 500, 2 p.o. b.i.d.  Additional medications:  1. Nu-Iron 150 b.i.d.  2. Protonix 40 mg daily in definitely.  She will not take aspirin at this point.   FOLLOW UP:  She will see  Dr. Jacky Kindle in approximately 2 weeks in the office,  at which point we will reassess her hemoglobin and status.                                                Geoffry Paradise, M.D.    RA/MEDQ  D:  02/01/2003  T:  02/02/2003  Job:  [id number]   cc:   Barbette Hair. Arlyce Dice, M.D. [facility]   Peter M. [place], M.D.  [address]  [place], [place] [address]  Fax: [phone]

## 2010-08-14 NOTE — H&P (Signed)
NAME:  Jacqueline Zuniga, Jacqueline Zuniga                          ACCOUNT NO.:  [id number]   MEDICAL RECORD NO.:  [id number]                   PATIENT TYPE:  OIB   LOCATION:                                       FACILITY:  [facility]   PHYSICIAN:  Peter M. Swaziland, M.D.               DATE OF BIRTH:  1925-11-14   DATE OF ADMISSION:  11/07/2003  DATE OF DISCHARGE:                                HISTORY & PHYSICAL   Jacqueline Zuniga is a 77 year old white female who has a history of coronary  artery disease, chronic anemia, diabetes mellitus, hypertension,  dyslipidemia.  She had presented, in January 2005, with acute pulmonary  edema and unstable angina.  At that time, she was found to have a complex  mid LAD stenosis.  This was successfully stented using a 3.0 x 32-mm TAXUS  drug-eluting stent.  She also had a moderate diagonal branch arising out of  this lesion which was successfully dilated through the stent.  She has a  history of severe anemia that has required transfusion.  She had some GI  bleed in the past but apparently a recent endoscopy, by Dr. Sherin Quarry, had  shown no significant pathology.  She presented with recurrent symptoms of  marked fatigue.  She gets exhausted easily with activity.  She also develops  mild pressure in the center of her chest when she does a lot of exertion.  To followup on these symptoms, she underwent an adenosine Cardiolite study  on November 04, 2003.  This demonstrated a marked ischemia involving the  anterior wall and apex.  Ejection fraction was decreased at 45%.  Based on  these findings, it was recommended she undergo repeat coronary angiography  as it is felt that she has a high risk of restenosis.   PAST MEDICAL HISTORY:  1. ASCAD status post stenting of the mid LAD.  2. Diabetes mellitus.  3. Chronic anemia.  4. History of GI bleed.  5. Hypertension.  6. Dyslipidemia.  7. The patient has also had prior hysterectomy.  8. She has had previous rotator cuff repair on the  left.   ALLERGIES:  MORPHINE.   CURRENT MEDICATIONS:  1. Metformin 1,000 mg b.i.d.  2. Actos 30 mg per day.  3. Glyburide 4 mg per day.  4. Diovan 160 mg daily.  5. Toprol XL 50 mg per day.  6. Gemfibrozil 600 mg b.i.d.  7. Aspirin 325 mg daily.  8. Iron sulfate 150 mg t.i.d.  She has previously been on Plavix, but her last dose was on November 04, 2003,  and it is currently being held.   SOCIAL HISTORY:  The patient is married.  She is retired.  She has two  children.  She denies tobacco or alcohol use.   FAMILY HISTORY:  Father died at age 71 with atherosclerosis.  Mother died at  age 63 with congestive heart failure.  One  brother died at age 84 with a  myocardial infarction.  One brother died at age 79 of a stroke and had also  had bypass surgery.  One sister died at age 44 with congestive heart  failure.   REVIEW OF SYSTEMS:  As noted in HPI, otherwise negative.   PHYSICAL EXAMINATION:  GENERAL:  The patient is a pleasant, obese, white  female in no apparent distress.  VITAL SIGNS:  Weight is 189, blood pressure is 136/68, pulse 76 and regular.  HEENT:  Pupils equal, round, reactive.  Conjunctivae are clear.  Oropharynx  is clear.  NECK:  Without JVD, adenopathy, or bruits.  She has no thyromegaly.  LUNGS:  Clear.  CARDIAC:  Reveals a grade 1/6 systolic murmur at the left sternal border.  There is no S3.  ABDOMEN:  Soft, nontender.  She has no masses or hepatosplenomegaly.  EXTREMITIES:  Without edema.  Pulses are 2+ and symmetric.  NEUROLOGIC:  Nonfocal.   LABORATORY DATA:  Resting ECG shows a normal sinus rhythm with possible old  septal infarct.  There is ST depression consistent with inferior ischemia  and a mild intraventricular conduction delay.   Chest x-ray shows no active disease.   Hemoglobin is 9.8, hematocrit 31.9, platelets 354,000, white count is 6,600.  Other labs are pending.   IMPRESSION:  1. Atherosclerotic coronary artery disease, status post  complex stenting of     the mid left anterior descending artery in October 2004, now with     symptoms of recurrent angina, markedly abnormal adenosine Cardiolite     study.  2. Diabetes mellitus, type 2.  3. Hypertension.  4. Hyperlipidemia.  5. Obesity.  6. History of chronic anemia with a history of gastrointestinal bleed.   PLAN:  Will undergo cardiac catheterization with further therapy pending  these results.  Plavix has been held since bypass surgery may be indicated.                                                Peter M. Swaziland, M.D.    PMJ/MEDQ  D:  11/05/2003  T:  11/05/2003  Job:  [id number]   cc:   Barry Dienes. Eloise Harman, M.D.  [address]  [place]  [place] [address]  Fax: [phone]   Tasia Catchings, M.D.  [address]  [place]  [place] [address]  Fax: [phone]

## 2010-08-14 NOTE — H&P (Signed)
NAME:  Jacqueline Zuniga, THAKUR                          ACCOUNT NO.:  [id number]   MEDICAL RECORD NO.:  [id number]                   PATIENT TYPE:  INP   LOCATION:  4705                                 FACILITY:  [facility]   PHYSICIAN:  Geoffry Paradise, M.D.               DATE OF BIRTH:  1926/02/25   DATE OF ADMISSION:  01/30/2003  DATE OF DISCHARGE:                                HISTORY & PHYSICAL   CHIEF COMPLAINT:  Dyspnea and weakness.   HISTORY OF PRESENT ILLNESS:  [name] is a 77 year old white female well-  known to myself with longstanding diabetes mellitus type 2, hypertension,  hyperlipidemia presenting at this time with several weeks of dyspnea on  exertion and fatigue.  Her last physical examination was last February at  which time she was doing well and had a normal hemoglobin.  She, as well,  had a Cardiolite stress test in 2002 which was negative as part of screening  secondary to multiple risk factors.  Additionally, she has had a colonoscopy  in March of 2004, which beyond a diminutive polyp was otherwise normal.  With this history in mind, she has had approximately six to eight weeks of  dyspnea on exertion and fatigue.  This has been associated with fullness in  the chest but no frank chest pain.  She was seen by my partner and referred  over to Dr. Sw[place]il[place] of cardiology where an adenosine Cardiolite study was  done January 24, 2003, which showed a large anterior defect partially  reversible which was new compared to 2002.  _________ put into place to  schedule admission and cardiac catheterization, whereby screening CBC  revealed a hemoglobin of approximately 5.  This was reconfirmed.  Upon  further questioning her, she has had some mild epigastric symptomatology  which has been waxing and waning for approximately six to eight weeks as  well.  She has had no frank bright red blood per rectum, no back pain, no  nausea or vomiting, but has had some questionable melanotic  stools.  She has  had no recent nonsteroidal anti-inflammatory utilization and has had no  prior upper GI pathology or history.  She is admitted at this time with a  rather severe anemia, unstable angina, and evidence for a subacute GI bleed.   ALLERGIES:  The patient is intolerant to MORPHINE.   MEDICATIONS:  1. Metformin 500 mg two p.o. b.i.d.  2. Actos 30 mg p.o. daily.  3. Glyburide 5 mg p.o. daily.  4. Diovan 160 p.o. daily.  5. Lopid 600 mg p.o. b.i.d.  6. Aspirin 81 mg daily.  7. Neurontin 300 mg p.o. b.i.d.   PAST MEDICAL HISTORY:  1. Diabetes mellitus type 2 for 20 to 30 years.  2. Essential hypertension.  3. Hyperlipidemia.  4. Diabetic neuropathy.   PAST SURGICAL HISTORY:  1. Total abdominal hysterectomy for endometrial carcinoma.  2. Rotator cuff  repair.  3. Cardiolite stress testing 2002 and again January 15, 2002.  4. Colonoscopy March 2004.   FAMILY HISTORY:  The patient father is deceased with atherosclerosis.  Multiple siblings either cardiac disease and/or stroke.  Mother deceased  with congestive heart failure.   SOCIAL HISTORY:  The patient is married, has two children and is retired.  She does not smoke or drink.   PHYSICAL EXAMINATION:  GENERAL APPEARANCE:  The patient is pleasant, in no  distress.  She does appear a bit pale.  VITAL SIGNS:  Temperature 98, blood pressure 150/70, pulse 65, respiratory  rate 18.  HEENT:  Good facial symmetry, anicteric.  Pupils round and reactive.  Extraocular movements intact.  NECK:  No JVD.  She does have a right carotid bruit, left carotid appears  clear.  LUNGS:  Clear.  CARDIOVASCULAR:  Regular rate and rhythm, 2/6 systolic ejection murmur at  left sternal border, no S3 or S4.  ABDOMEN:  Soft, nontender, no hepatosplenomegaly.  Bowel sounds normal, no  bruits.  BACK:  Without CVA or spinal tenderness.  EXTREMITIES:  Some trace edema.  No cyanosis or clubbing.  Intact distal  pulses.  Joints appear normal.   She is warm distally.  No mottling.  NEUROLOGIC:  She is nonlateralizing, nonfocal with intact high cortical  function.  She is bright, alert and conversant and in no distress.   ASSESSMENT:  1. Subacute gastrointestinal bleed, most likely upper source.     Considerations include peptic ulcer disease and/or gastritis with recent     negative colonoscopy.  2. Dyspnea on exertion and questionable anginal equivalent precipitated by     profound anemia with abnormal Cardiolite study.  3. Diabetes mellitus type 2, stable.  4. Essential hypertension, stable.  5. Diabetic neuropathy, stable.   PLAN:  The patient is admitted for transfusion, upper GI endoscopy with GI  consultation and ultimately will need a cardiac catheterization as well.                                                Geoffry Paradise, M.D.    RA/MEDQ  D:  01/31/2003  T:  01/31/2003  Job:  [id number]

## 2010-08-27 ENCOUNTER — Encounter: Payer: Self-pay | Admitting: Internal Medicine

## 2010-11-18 ENCOUNTER — Other Ambulatory Visit: Payer: Self-pay | Admitting: *Deleted

## 2010-11-18 MED ORDER — CHOLINE FENOFIBRATE 135 MG PO CPDR: 135.0000 mg | DELAYED_RELEASE_CAPSULE | Freq: Every day | ORAL | Status: DC

## 2011-01-08 ENCOUNTER — Ambulatory Visit: Payer: Medicare HMO | Admitting: Cardiology

## 2011-01-12 ENCOUNTER — Ambulatory Visit (INDEPENDENT_AMBULATORY_CARE_PROVIDER_SITE_OTHER): Payer: Medicare HMO | Admitting: Cardiology

## 2011-01-12 ENCOUNTER — Encounter: Payer: Self-pay | Admitting: Cardiology

## 2011-01-12 VITALS — BP 126/60 | HR 67 | Ht 63.0 in | Wt 182.4 lb

## 2011-01-12 DIAGNOSIS — E119 Type 2 diabetes mellitus without complications: Secondary | ICD-10-CM

## 2011-01-12 DIAGNOSIS — I2510 Atherosclerotic heart disease of native coronary artery without angina pectoris: Secondary | ICD-10-CM

## 2011-01-12 DIAGNOSIS — E7800 Pure hypercholesterolemia, unspecified: Secondary | ICD-10-CM

## 2011-01-12 DIAGNOSIS — I10 Essential (primary) hypertension: Secondary | ICD-10-CM

## 2011-01-12 NOTE — Progress Notes (Signed)
[name] Date of Birth: [birth year]   History of Present Illness: Mrs. Ambrosino is seen for followup today. She states she's been doing very well from a cardiac standpoint. She denies any symptoms of chest pain, shortness of breath, or palpitations. She did have carotid Doppler studies which demonstrated a 50% blockage. She has no symptoms of TIA or stroke. She was experiencing some light flashes in the periphery of her eye vision. She still is having significant back pain. She never did have back surgery but is reconsidering this.  Current Outpatient Prescriptions on File Prior to Visit  Medication Sig Dispense Refill  . aspirin 81 MG tablet Take 81 mg by mouth daily.        . B Complex-C (SUPER B COMPLEX PO) Take 1 tablet by mouth daily.        . Calcium Carbonate-Vitamin D (CALCIUM + D PO) Take 1 tablet by mouth 2 (two) times daily.        . carvedilol (COREG) 12.5 MG tablet Take 12.5 mg by mouth 2 (two) times daily with a meal.        . Cholecalciferol (VITAMIN D3) 400 UNITS tablet Take 400 Units by mouth daily.        . Choline Fenofibrate 135 MG capsule Take 1 capsule (135 mg total) by mouth daily.  30 capsule  5  . DIOVAN HCT 160-12.5 MG per tablet Take 1 tablet by mouth Daily.      . Ferrous Sulfate Dried 140 (45 FE) MG TBCR Take 1 tablet by mouth daily.        . furosemide (LASIX) 20 MG tablet Take 20 mg by mouth daily. Taking prn      . gabapentin (NEURONTIN) 100 MG capsule Take 300 mg by mouth 4 (four) times daily.        . hydrocodone-acetaminophen (LORCET-HD) 5-500 MG per capsule Take 1 capsule by mouth as needed.        . insulin detemir (LEVEMIR) 100 UNIT/ML injection Inject into the skin 2 (two) times daily. As directed        . insulin NPH (HUMULIN N,NOVOLIN N) 100 UNIT/ML injection Inject into the skin 2 (two) times daily. As directed       . metFORMIN (GLUCOPHAGE) 1000 MG tablet Take 1,000 mg by mouth 2 (two) times daily with a meal.        . multivitamin (THERAGRAN)  per tablet Take 1 tablet by mouth daily.        . nitroGLYCERIN (NITROSTAT) 0.4 MG SL tablet Place 0.4 mg under the tongue every 5 (five) minutes as needed.        . pravastatin (PRAVACHOL) 40 MG tablet Take 1 tablet by mouth Daily.      Marland Kitchen TAMOXIFEN CITRATE PO Take 1 capsule by mouth daily.        . vitamin B-12 (CYANOCOBALAMIN) 500 MCG tablet Take 500 mcg by mouth daily.        . vitamin E 400 UNIT capsule Take 400 Units by mouth daily.        . Coenzyme Q10 (COQ-10 PO) Take 1 tablet by mouth daily.        Marland Kitchen DISCONTD: omeprazole (PRILOSEC) 20 MG capsule Take 20 mg by mouth daily.          Allergies  Allergen Reactions  . Morphine     Past Medical History  Diagnosis Date  . Hypertension   . Diabetes mellitus   . Hypercholesterolemia   .  Breast cancer, stage 1   . Bleeding gastric ulcer   . Esophageal ulcer with bleeding   . Coronary artery disease   . CHF (congestive heart failure)     history of  . History of anemia     chronic    Past Surgical History  Procedure Date  . Coronary artery bypass graft   . Cardiac catheterization 04/01/2003    1. Left heart catheterization. 2. Coronary angiography  3. Left ventricular angiography. 4 . Stenting of the mid left anterior descending.  . Rotator cuff repair   . Breast lumpectomy     right  . Mastectomy partial / lumpectomy     left    History  Smoking status  . Former Smoker -- 1.0 packs/day for 20 years  . Types: Cigarettes  . Quit date: 03/29/1964  Smokeless tobacco  . Never Used    History  Alcohol Use No    Family History  Problem Relation Age of Onset  . Heart failure Mother   . Heart attack Brother   . Coronary artery disease Father   . Heart failure Sister   . Stroke Brother     had bypass surgery    Review of Systems: The review of systems is positive for fatigue.  She states her blood pressure readings at home have been good.she has chronic back pain. All other systems were reviewed and are  negative.  Physical Exam: BP 126/60  Pulse 67  Ht 5\' 3"  (1.6 m)  Wt 182 lb 6.4 oz (82.736 kg)  BMI 32.31 kg/m2 She is an overweight white female in no acute distress. HEENT exam is unremarkable. She is normocephalic, atraumatic. Pupils are equal round and reactive. Sclera are clear. She has no JVD or bruits. Lungs are clear. Cardiac exam reveals a regular rate and rhythm without gallop, murmur, or click. Abdomen is soft and nontender. She has no edema. Pedal pulses are good. She is alert oriented x3. Cranial nerves II through XII are intact. LABORATORY DATA: ECG today demonstrates normal sinus rhythm with left axis deviation. She has LVH with repolarization abnormality. There is a possible septal infarct age undetermined.  Assessment / Plan:

## 2011-01-12 NOTE — Assessment & Plan Note (Signed)
Blood pressure control is acceptable today.

## 2011-01-12 NOTE — Patient Instructions (Signed)
Continue your current medications  I will see you again in 6 months.   

## 2011-01-12 NOTE — Assessment & Plan Note (Signed)
She remains asymptomatic a cardiac standpoint. Her last stress test in February 2010 was normal. Her ECG today is unchanged. We will continue with her medical therapy. If she does require back surgery she will be cleared from a cardiac standpoint.

## 2011-07-13 ENCOUNTER — Encounter: Payer: Self-pay | Admitting: Cardiology

## 2011-07-13 ENCOUNTER — Ambulatory Visit (INDEPENDENT_AMBULATORY_CARE_PROVIDER_SITE_OTHER): Payer: Medicare Other | Admitting: Cardiology

## 2011-07-13 VITALS — BP 152/70 | HR 62 | Ht 62.0 in | Wt 181.4 lb

## 2011-07-13 DIAGNOSIS — I10 Essential (primary) hypertension: Secondary | ICD-10-CM

## 2011-07-13 DIAGNOSIS — I2510 Atherosclerotic heart disease of native coronary artery without angina pectoris: Secondary | ICD-10-CM

## 2011-07-13 DIAGNOSIS — E119 Type 2 diabetes mellitus without complications: Secondary | ICD-10-CM

## 2011-07-13 NOTE — Assessment & Plan Note (Signed)
She is asymptomatic. She is status post CABG in 2005. Her last nuclear stress test in February 2000 and was normal. We will continue with her current medical therapy including aspirin and carvedilol.

## 2011-07-13 NOTE — Patient Instructions (Signed)
Continue your current therapy. If your blood pressure stays high we may need to try different medication.  I will see you again in 6 months.

## 2011-07-13 NOTE — Progress Notes (Signed)
Jacqueline Zuniga Date of Birth: [birth year]   History of Present Illness: Jacqueline Zuniga is seen for followup today. She reports that she had a bad virus 4 weeks ago associated with a lot of chest congestion. She still has a hacking cough but it is getting better. She was seen by her primary care recently and her metformin and Diovan HCT were discontinued because of increased creatinine. She is scheduled have this repeated in 2 weeks. She denies any significant chest pain or shortness of breath. She has had no significant edema but has had some slight swelling in her ankles.  Current Outpatient Prescriptions on File Prior to Visit  Medication Sig Dispense Refill  . aspirin 81 MG tablet Take 81 mg by mouth daily.        . B Complex-C (SUPER B COMPLEX PO) Take 1 tablet by mouth daily.        . Calcium Carbonate-Vitamin D (CALCIUM + D PO) Take 1 tablet by mouth 2 (two) times daily.        . carvedilol (COREG) 12.5 MG tablet Take 12.5 mg by mouth 2 (two) times daily with a meal.        . Cholecalciferol (VITAMIN D3) 400 UNITS tablet Take 400 Units by mouth daily.        . Choline Fenofibrate 135 MG capsule Take 1 capsule (135 mg total) by mouth daily.  30 capsule  5  . DIOVAN HCT 160-12.5 MG per tablet Take 1 tablet by mouth Daily. Stop for 2 weeks      . Ferrous Sulfate Dried 140 (45 FE) MG TBCR Take 1 tablet by mouth daily.        . furosemide (LASIX) 20 MG tablet Take 20 mg by mouth daily. Taking prn      . gabapentin (NEURONTIN) 100 MG capsule Take 300 mg by mouth 4 (four) times daily. Stop for 2 weeks      . hydrocodone-acetaminophen (LORCET-HD) 5-500 MG per capsule Take 1 capsule by mouth as needed.        . insulin detemir (LEVEMIR) 100 UNIT/ML injection Inject into the skin 2 (two) times daily. As directed        . insulin NPH (HUMULIN N,NOVOLIN N) 100 UNIT/ML injection Inject into the skin 2 (two) times daily. As directed       . metFORMIN (GLUCOPHAGE) 1000 MG tablet Take 1,000 mg by mouth  2 (two) times daily with a meal.        . multivitamin (THERAGRAN) per tablet Take 1 tablet by mouth daily.        . nitroGLYCERIN (NITROSTAT) 0.4 MG SL tablet Place 0.4 mg under the tongue every 5 (five) minutes as needed.        Marland Kitchen omeprazole (PRILOSEC) 20 MG capsule Take 1 tablet by mouth Daily.      . pravastatin (PRAVACHOL) 40 MG tablet Take 1 tablet by mouth Daily.      Marland Kitchen TAMOXIFEN CITRATE PO Take 1 capsule by mouth daily.        . vitamin E 400 UNIT capsule Take 400 Units by mouth daily.          Allergies  Allergen Reactions  . Morphine     Past Medical History  Diagnosis Date  . Hypertension   . Diabetes mellitus   . Hypercholesterolemia   . Breast cancer, stage 1   . Bleeding gastric ulcer   . Esophageal ulcer with bleeding   . Coronary artery  disease   . CHF (congestive heart failure)     history of  . History of anemia     chronic    Past Surgical History  Procedure Date  . Coronary artery bypass graft   . Cardiac catheterization 04/01/2003    1. Left heart catheterization. 2. Coronary angiography  3. Left ventricular angiography. 4 . Stenting of the mid left anterior descending.  . Rotator cuff repair   . Breast lumpectomy     right  . Mastectomy partial / lumpectomy     left    History  Smoking status  . Former Smoker -- 1.0 packs/day for 20 years  . Types: Cigarettes  . Quit date: 03/29/1964  Smokeless tobacco  . Never Used    History  Alcohol Use No    Family History  Problem Relation Age of Onset  . Heart failure Mother   . Heart attack Brother   . Coronary artery disease Father   . Heart failure Sister   . Stroke Brother     had bypass surgery    Review of Systems: As noted in history of present illness. All other systems were reviewed and are negative.  Physical Exam: BP 152/70  Pulse 62  Ht 5\' 2"  (1.575 m)  Wt 181 lb 6.4 oz (82.283 kg)  BMI 33.18 kg/m2 She is an overweight white female in no acute distress. HEENT exam is  unremarkable.  Sclera are clear. She has no JVD or bruits. Lungs are clear. Cardiac exam reveals a regular rate and rhythm without gallop, murmur, or click. Abdomen is soft and nontender. She has no edema. Pedal pulses are good. She is alert oriented x3. Cranial nerves II through XII are intact. LABORATORY DATA:   Assessment / Plan:

## 2011-07-13 NOTE — Assessment & Plan Note (Signed)
Blood pressure is elevated. Her Diovan HCT was recently discontinued because of renal concerns. I asked that she monitor her blood pressure closely. She remains elevated I would consider addition of a calcium channel blocker such as amlodipine. She will continue with her carvedilol.

## 2011-08-12 ENCOUNTER — Encounter (INDEPENDENT_AMBULATORY_CARE_PROVIDER_SITE_OTHER): Payer: Self-pay | Admitting: General Surgery

## 2011-08-12 DIAGNOSIS — Z853 Personal history of malignant neoplasm of breast: Secondary | ICD-10-CM

## 2011-08-12 HISTORY — DX: Personal history of malignant neoplasm of breast: Z85.3

## 2011-08-13 ENCOUNTER — Encounter (INDEPENDENT_AMBULATORY_CARE_PROVIDER_SITE_OTHER): Payer: Self-pay | Admitting: Surgery

## 2011-08-13 ENCOUNTER — Ambulatory Visit (INDEPENDENT_AMBULATORY_CARE_PROVIDER_SITE_OTHER): Payer: Medicare Other | Admitting: Surgery

## 2011-08-13 VITALS — BP 152/72 | HR 60 | Temp 98.0°F | Resp 16 | Ht 62.0 in | Wt 184.0 lb

## 2011-08-13 DIAGNOSIS — Z853 Personal history of malignant neoplasm of breast: Secondary | ICD-10-CM

## 2011-08-13 NOTE — Progress Notes (Signed)
NAME: Jacqueline Zuniga       DOB: 1925-12-31           DATE: 08/13/2011       MRN: [id number]   [name] is a 78 y.o.Marland Kitchenfemale who presents for routine followup of her hx: breast cancer, right IDC ER + PR - Her 2 -, left paget's disease/DCIS receptor - diagnosed in 2010&2012 and treated with Lumpectomy right, central partial mastectomy Left with radiation. She has no problems or concerns on either side.  PFSH: She has had no significant changes since the last visit here.  ROS: There have been no significant changes since the last visit here  EXAM: General: The patient is alert, oriented, generally healty appearing, NAD. Mood and affect are normal.  Breasts:  Right shows loss of tissue at lumpectomy site, otherwise negative. Left shows post radiation change and slightly tender, no evidence of recurrence on either sider  Lymphatics: She has no axillary or supraclavicular adenopathy on either side.  Extremities: Full ROM of the surgical side with no lymphedema noted.  Data Reviewed: She says mammogram in November in [place] was wnl Impression: Doing well, with no evidence of recurrent cancer or new cancer  Plan:  RTC PRN as she is closely followed in [place]

## 2011-12-09 ENCOUNTER — Other Ambulatory Visit: Payer: Self-pay | Admitting: Cardiology

## 2012-01-03 ENCOUNTER — Ambulatory Visit (INDEPENDENT_AMBULATORY_CARE_PROVIDER_SITE_OTHER): Payer: Medicare Other | Admitting: Cardiology

## 2012-01-03 ENCOUNTER — Encounter: Payer: Self-pay | Admitting: Cardiology

## 2012-01-03 VITALS — BP 160/80 | HR 69 | Ht 62.0 in | Wt 183.8 lb

## 2012-01-03 DIAGNOSIS — E7800 Pure hypercholesterolemia, unspecified: Secondary | ICD-10-CM

## 2012-01-03 DIAGNOSIS — I10 Essential (primary) hypertension: Secondary | ICD-10-CM

## 2012-01-03 DIAGNOSIS — I2510 Atherosclerotic heart disease of native coronary artery without angina pectoris: Secondary | ICD-10-CM

## 2012-01-03 MED ORDER — AMLODIPINE BESYLATE 2.5 MG PO TABS: 2.5000 mg | ORAL_TABLET | Freq: Every day | ORAL | Status: DC

## 2012-01-03 NOTE — Patient Instructions (Addendum)
We will add amlodipine 2.5 mg daily for blood pressure.  Continue your other therapy.  I will see you in 6 months.

## 2012-01-03 NOTE — Progress Notes (Signed)
Jacqueline Zuniga Date of Birth: [birth year]   History of Present Illness: Jacqueline Zuniga is seen for followup today. She has a history of coronary disease and is status post stenting of the LAD in 2005. Her last stress test in February 2010 was normal. She also a history of diabetes mellitus, hypertension, and hyperlipidemia. She was taken off of Diovan HCT because of renal insufficiency. Her blood pressure has been consistently elevated since then. She notes occasional swelling in her ankles. She takes Lasix on a when necessary basis. She denies any chest pain or shortness of breath. She denies any palpitations.  Current Outpatient Prescriptions on File Prior to Visit  Medication Sig Dispense Refill  . aspirin 81 MG tablet Take 81 mg by mouth daily.        . B Complex-C (SUPER B COMPLEX PO) Take 1 tablet by mouth daily.        . Calcium Carbonate-Vitamin D (CALCIUM + D PO) Take 1 tablet by mouth 2 (two) times daily.        . carvedilol (COREG) 12.5 MG tablet Take 12.5 mg by mouth 2 (two) times daily with a meal.        . Cholecalciferol (VITAMIN D3) 400 UNITS tablet Take 400 Units by mouth daily.        . Ferrous Sulfate Dried 140 (45 FE) MG TBCR Take 1 tablet by mouth daily.        . furosemide (LASIX) 20 MG tablet Take 20 mg by mouth daily. Taking prn      . gabapentin (NEURONTIN) 100 MG capsule Take 300 mg by mouth 4 (four) times daily. Stop for 2 weeks      . hydrocodone-acetaminophen (LORCET-HD) 5-500 MG per capsule Take 1 capsule by mouth as needed.        . insulin detemir (LEVEMIR) 100 UNIT/ML injection Inject into the skin 2 (two) times daily. As directed        . insulin NPH (HUMULIN N,NOVOLIN N) 100 UNIT/ML injection Inject into the skin 2 (two) times daily. As directed       . multivitamin (THERAGRAN) per tablet Take 1 tablet by mouth daily.        . nitroGLYCERIN (NITROSTAT) 0.4 MG SL tablet Place 0.4 mg under the tongue every 5 (five) minutes as needed.        Marland Kitchen omeprazole  (PRILOSEC) 20 MG capsule Take 1 tablet by mouth Daily.      . pravastatin (PRAVACHOL) 40 MG tablet Take 1 tablet by mouth Daily.      Marland Kitchen TAMOXIFEN CITRATE PO Take 1 capsule by mouth daily.        . TRILIPIX 135 MG capsule TAKE (1) CAPSULE DAILY  30 each  6  . vitamin E 400 UNIT capsule Take 400 Units by mouth daily.        Marland Kitchen amLODipine (NORVASC) 2.5 MG tablet Take 1 tablet (2.5 mg total) by mouth daily.  180 tablet  3    Allergies  Allergen Reactions  . Morphine     Past Medical History  Diagnosis Date  . Hypertension   . Diabetes mellitus   . Hypercholesterolemia   . Bleeding gastric ulcer   . Esophageal ulcer with bleeding   . Coronary artery disease   . CHF (congestive heart failure)     history of  . History of anemia     chronic  . Anemia   . Arthritis   . Breast cancer, stage 1  bilateral  . Myocardial infarction   . hx: breast cancer, right IDC ER + PR - Her 2 -, left paget's disease/DCIS receptor -  08/12/2011    Patient diagnosed with right breast invasive ductal carcinoma 04/25/2008. Patient underwent right partial mastectomy on 05/21/2008. Pathology showed invasive ductal carcinoma, margins negative. ER positive at 99%. PR negative. Her 2 negative. Ki 67 at 25%. Patient then diagnosed with left nipple paget's disease on 04/01/2010. Patient underwent left lumpectomy on 04/30/2010. Pathology showed paget's dise    Past Surgical History  Procedure Date  . Coronary artery bypass graft   . Cardiac catheterization 04/01/2003    1. Left heart catheterization. 2. Coronary angiography  3. Left ventricular angiography. 4 . Stenting of the mid left anterior descending.  . Rotator cuff repair   . Breast lumpectomy     right  . Mastectomy partial / lumpectomy     left  . Abdominal hysterectomy 1966    History  Smoking status  . Former Smoker -- 1.0 packs/day for 20 years  . Types: Cigarettes  . Quit date: 03/29/1964  Smokeless tobacco  . Never Used    History  Alcohol  Use No    Family History  Problem Relation Age of Onset  . Heart failure Mother   . Heart disease Mother   . Heart attack Brother   . Coronary artery disease Father   . Heart failure Sister   . Stroke Brother     had bypass surgery    Review of Systems: As noted in history of present illness. All other systems were reviewed and are negative.  Physical Exam: BP 160/80  Pulse 69  Ht 5\' 2"  (1.575 m)  Wt 183 lb 12.8 oz (83.371 kg)  BMI 33.62 kg/m2  SpO2 93% She is an overweight white female in no acute distress. HEENT exam is unremarkable.  Sclera are clear. She has no JVD or bruits. Lungs are clear. Cardiac exam reveals a regular rate and rhythm without gallop, murmur, or click. Abdomen is soft and nontender. She has no edema. Pedal pulses are good. She is alert oriented x3. Cranial nerves II through XII are intact. LABORATORY DATA: ECG today demonstrates normal sinus rhythm with left axis deviation. There is LVH with QRS widening and repolarization abnormality. Possible old septal infarct. This is unchanged from October of 2012.  Assessment / Plan: 1. Coronary disease status post remote stenting of the LAD. She is asymptomatic. Continue aspirin and carvedilol. Risk factor modification.  2. Hypertension, poorly controlled. She has a history of cough on ACE inhibitors. Renal insufficiency on Diovan HCT. We will try her on low-dose of amlodipine 2.5 mg daily. We'll need to observe for increased edema.  3. Diabetes mellitus type 2.  4. Hypercholesterolemia.

## 2012-04-25 ENCOUNTER — Telehealth: Payer: Self-pay

## 2012-04-25 MED ORDER — FENOFIBRATE 160 MG PO TABS: 160.0000 mg | ORAL_TABLET | Freq: Every day | ORAL | Status: DC

## 2012-04-25 NOTE — Telephone Encounter (Signed)
Patient called was told Dr.Jordan received letter you mailed him.Dr.Jordan advised since insurance will no longer cover fenofibric 135 mg,advised to take generic fenofibrate 160 mg daily.Prescription sent to [facility] pharmacy.Advised to call back if needed.

## 2012-05-16 ENCOUNTER — Encounter (INDEPENDENT_AMBULATORY_CARE_PROVIDER_SITE_OTHER): Payer: Medicare Other | Admitting: Internal Medicine

## 2012-05-16 DIAGNOSIS — C50919 Malignant neoplasm of unspecified site of unspecified female breast: Secondary | ICD-10-CM

## 2012-06-01 ENCOUNTER — Ambulatory Visit (INDEPENDENT_AMBULATORY_CARE_PROVIDER_SITE_OTHER): Payer: Medicare Other | Admitting: Cardiology

## 2012-06-01 ENCOUNTER — Encounter: Payer: Self-pay | Admitting: Cardiology

## 2012-06-01 VITALS — BP 138/70 | HR 63 | Ht 62.0 in | Wt 185.8 lb

## 2012-06-01 DIAGNOSIS — I10 Essential (primary) hypertension: Secondary | ICD-10-CM

## 2012-06-01 DIAGNOSIS — E7800 Pure hypercholesterolemia, unspecified: Secondary | ICD-10-CM

## 2012-06-01 DIAGNOSIS — I2510 Atherosclerotic heart disease of native coronary artery without angina pectoris: Secondary | ICD-10-CM

## 2012-06-01 MED ORDER — NITROGLYCERIN 0.4 MG SL SUBL: 0.4000 mg | SUBLINGUAL_TABLET | SUBLINGUAL | Status: AC | PRN

## 2012-06-01 MED ORDER — AMLODIPINE BESYLATE 2.5 MG PO TABS: 2.5000 mg | ORAL_TABLET | Freq: Every day | ORAL | Status: DC

## 2012-06-01 MED ORDER — FUROSEMIDE 20 MG PO TABS: 20.0000 mg | ORAL_TABLET | Freq: Every day | ORAL | Status: DC

## 2012-06-01 MED ORDER — OMEPRAZOLE 20 MG PO CPDR: 20.0000 mg | DELAYED_RELEASE_CAPSULE | Freq: Every day | ORAL | Status: DC

## 2012-06-01 MED ORDER — FENOFIBRATE 160 MG PO TABS: 160.0000 mg | ORAL_TABLET | Freq: Every day | ORAL | Status: DC

## 2012-06-01 MED ORDER — CARVEDILOL 12.5 MG PO TABS: 12.5000 mg | ORAL_TABLET | Freq: Two times a day (BID) | ORAL | Status: DC

## 2012-06-01 MED ORDER — PRAVASTATIN SODIUM 40 MG PO TABS: 40.0000 mg | ORAL_TABLET | Freq: Every day | ORAL | Status: DC

## 2012-06-01 NOTE — Progress Notes (Signed)
Jacqueline Zuniga Date of Birth: March 08, 1926   History of Present Illness: Jacqueline Zuniga is seen for followup today. She has a history of coronary disease and is status post stenting of the LAD in 2005. Her last stress test in February 2010 was normal. She also a history of diabetes mellitus, hypertension, and hyperlipidemia. She still has some ankle edema but it has not changed significantly. She denies any chest pain or dyspnea. She has some imbalance and uses a cane.  Current Outpatient Prescriptions on File Prior to Visit  Medication Sig Dispense Refill  . aspirin 81 MG tablet Take 81 mg by mouth daily.        . B Complex-C (SUPER B COMPLEX PO) Take 1 tablet by mouth daily.        . Calcium Carbonate-Vitamin D (CALCIUM + D PO) Take 1 tablet by mouth 2 (two) times daily.        . Cholecalciferol (VITAMIN D3) 400 UNITS tablet Take 400 Units by mouth daily.        . Ferrous Sulfate Dried 140 (45 FE) MG TBCR Take 1 tablet by mouth daily.        Marland Kitchen gabapentin (NEURONTIN) 100 MG capsule Take 300 mg by mouth 4 (four) times daily. Stop for 2 weeks      . hydrocodone-acetaminophen (LORCET-HD) 5-500 MG per capsule Take 1 capsule by mouth as needed.        . multivitamin (THERAGRAN) per tablet Take 1 tablet by mouth daily.        Marland Kitchen TAMOXIFEN CITRATE PO Take 1 capsule by mouth daily.        . vitamin E 400 UNIT capsule Take 400 Units by mouth daily.         No current facility-administered medications on file prior to visit.    Allergies  Allergen Reactions  . Morphine     Past Medical History  Diagnosis Date  . Hypertension   . Diabetes mellitus   . Hypercholesterolemia   . Bleeding gastric ulcer   . Esophageal ulcer with bleeding   . Coronary artery disease   . CHF (congestive heart failure)     history of  . History of anemia     chronic  . Anemia   . Arthritis   . Breast cancer, stage 1     bilateral  . Myocardial infarction   . hx: breast cancer, right IDC ER + PR - Her 2 -,  left paget's disease/DCIS receptor -  08/12/2011    Patient diagnosed with right breast invasive ductal carcinoma 04/25/2008. Patient underwent right partial mastectomy on 05/21/2008. Pathology showed invasive ductal carcinoma, margins negative. ER positive at 99%. PR negative. Her 2 negative. Ki 67 at 25%. Patient then diagnosed with left nipple paget's disease on 04/01/2010. Patient underwent left lumpectomy on 04/30/2010. Pathology showed paget's dise    Past Surgical History  Procedure Laterality Date  . Coronary artery bypass graft    . Cardiac catheterization  04/01/2003    1. Left heart catheterization. 2. Coronary angiography  3. Left ventricular angiography. 4 . Stenting of the mid left anterior descending.  . Rotator cuff repair    . Breast lumpectomy      right  . Mastectomy partial / lumpectomy      left  . Abdominal hysterectomy  1966    History  Smoking status  . Former Smoker -- 1.00 packs/day for 20 years  . Types: Cigarettes  . Quit date: 03/29/1964  Smokeless tobacco  . Never Used    History  Alcohol Use No    Family History  Problem Relation Age of Onset  . Heart failure Mother   . Heart disease Mother   . Heart attack Brother   . Coronary artery disease Father   . Heart failure Sister   . Stroke Brother     had bypass surgery    Review of Systems: As noted in history of present illness. All other systems were reviewed and are negative.  Physical Exam: BP 138/70  Pulse 63  Ht 5\' 2"  (1.575 m)  Wt 185 lb 12.8 oz (84.278 kg)  BMI 33.97 kg/m2  SpO2 93% She is an overweight white female in no acute distress. HEENT exam is unremarkable.  Sclera are clear. She has no JVD or bruits. Lungs are clear. Cardiac exam reveals a regular rate and rhythm without gallop, murmur, or click. Abdomen is soft and nontender. She has no edema. Pedal pulses are good. She is alert oriented x3. Cranial nerves II through XII are intact. LABORATORY DATA:  Assessment / Plan: 1.  Coronary disease status post remote stenting of the LAD. She is asymptomatic. Continue aspirin and carvedilol. Risk factor modification.  2. Hypertension, improved controlled on amlodipine. She has a history of cough on ACE inhibitors. Renal insufficiency on Diovan HCT.   3. Diabetes mellitus type 2.  4. Hypercholesterolemia.

## 2012-06-01 NOTE — Patient Instructions (Signed)
Continue your current therapy  I will see you in 6 months.   

## 2012-09-25 ENCOUNTER — Encounter: Payer: Self-pay | Admitting: Cardiology

## 2013-01-26 ENCOUNTER — Ambulatory Visit: Payer: Medicare Other | Admitting: Cardiology

## 2013-03-05 ENCOUNTER — Encounter: Payer: Self-pay | Admitting: Cardiology

## 2013-03-05 ENCOUNTER — Ambulatory Visit
Admission: RE | Admit: 2013-03-05 | Discharge: 2013-03-05 | Disposition: A | Discharge status: Home / Self Care | Payer: Medicare Other | Source: Ambulatory Visit | Attending: Cardiology | Admitting: Cardiology

## 2013-03-05 ENCOUNTER — Ambulatory Visit (INDEPENDENT_AMBULATORY_CARE_PROVIDER_SITE_OTHER): Payer: Medicare Other | Admitting: Cardiology

## 2013-03-05 ENCOUNTER — Encounter (INDEPENDENT_AMBULATORY_CARE_PROVIDER_SITE_OTHER): Payer: Self-pay

## 2013-03-05 ENCOUNTER — Other Ambulatory Visit: Payer: Self-pay

## 2013-03-05 VITALS — BP 130/60 | HR 62 | Ht 62.0 in | Wt 184.0 lb

## 2013-03-05 DIAGNOSIS — Z853 Personal history of malignant neoplasm of breast: Secondary | ICD-10-CM

## 2013-03-05 DIAGNOSIS — R0600 Dyspnea, unspecified: Secondary | ICD-10-CM | POA: Insufficient documentation

## 2013-03-05 DIAGNOSIS — I2510 Atherosclerotic heart disease of native coronary artery without angina pectoris: Secondary | ICD-10-CM

## 2013-03-05 DIAGNOSIS — I10 Essential (primary) hypertension: Secondary | ICD-10-CM

## 2013-03-05 DIAGNOSIS — E7800 Pure hypercholesterolemia, unspecified: Secondary | ICD-10-CM

## 2013-03-05 DIAGNOSIS — R0602 Shortness of breath: Secondary | ICD-10-CM

## 2013-03-05 LAB — CBC WITH DIFFERENTIAL/PLATELET
Basophils Absolute: 0 10*3/uL (ref 0.0–0.1)
Basophils Relative: 0.2 % (ref 0.0–3.0)
Eosinophils Absolute: 0 10*3/uL (ref 0.0–0.7)
HCT: 34.1 % — ABNORMAL LOW (ref 36.0–46.0)
Hemoglobin: 11.4 g/dL — ABNORMAL LOW (ref 12.0–15.0)
Lymphocytes Relative: 9.6 % — ABNORMAL LOW (ref 12.0–46.0)
Lymphs Abs: 1 10*3/uL (ref 0.7–4.0)
MCHC: 33.3 g/dL (ref 30.0–36.0)
MCV: 90.9 fl (ref 78.0–100.0)
Monocytes Absolute: 0.8 10*3/uL (ref 0.1–1.0)
Monocytes Relative: 7.7 % (ref 3.0–12.0)
Neutro Abs: 8.4 10*3/uL — ABNORMAL HIGH (ref 1.4–7.7)
RDW: 15.2 % — ABNORMAL HIGH (ref 11.5–14.6)
WBC: 10.2 10*3/uL (ref 4.5–10.5)

## 2013-03-05 LAB — BASIC METABOLIC PANEL
BUN: 51 mg/dL — ABNORMAL HIGH (ref 6–23)
CO2: 24 mEq/L (ref 19–32)
Calcium: 8.4 mg/dL (ref 8.4–10.5)
Chloride: 105 mEq/L (ref 96–112)
Creatinine, Ser: 2.1 mg/dL — ABNORMAL HIGH (ref 0.4–1.2)
GFR: 23.87 mL/min — ABNORMAL LOW (ref >60.00)
Glucose, Bld: 289 mg/dL — ABNORMAL HIGH (ref 70–99)
Potassium: 4.1 mEq/L (ref 3.5–5.1)

## 2013-03-05 LAB — BRAIN NATRIURETIC PEPTIDE: Pro B Natriuretic peptide (BNP): 271 pg/mL — ABNORMAL HIGH (ref 0.0–100.0)

## 2013-03-05 LAB — D-DIMER, QUANTITATIVE: D-Dimer, Quant: 1.28 ug/mL-FEU — ABNORMAL HIGH (ref 0.00–0.48)

## 2013-03-05 NOTE — Addendum Note (Signed)
Addended by: Tonita Phoenix on: 03/05/2013 11:47 AM   Modules accepted: Orders

## 2013-03-05 NOTE — Progress Notes (Signed)
Jacqueline Zuniga Date of Birth: 09-06-1925   History of Present Illness: Jacqueline Zuniga is seen for followup today. Jacqueline Zuniga has a history of coronary disease and is status post stenting of the LAD in 2005. Her last stress test in February 2010 was normal. Jacqueline Zuniga also a history of diabetes mellitus, hypertension, and hyperlipidemia. Jacqueline Zuniga states Jacqueline Zuniga is not doing well. Last week Jacqueline Zuniga developed pain in her back in the left parasternal region. This seemed better with Crista Elliot. Friday Jacqueline Zuniga awoke with acute dyspnea. Jacqueline Zuniga has a mild cough. Today Jacqueline Zuniga had some hemoptysis when Jacqueline Zuniga cleared her throat. Jacqueline Zuniga also notes some mid sternal chest discomfort that feels like indigestion. No fever, chills, aches, or N/V. Does have a history of dysphagia for solid foods.  Current Outpatient Prescriptions on File Prior to Visit  Medication Sig Dispense Refill  . amLODipine (NORVASC) 2.5 MG tablet Take 1 tablet (2.5 mg total) by mouth daily.  180 tablet  3  . aspirin 81 MG tablet Take 81 mg by mouth daily.        . B Complex-C (SUPER B COMPLEX PO) Take 1 tablet by mouth daily.        . Calcium Carbonate-Vitamin D (CALCIUM + D PO) Take 1 tablet by mouth 2 (two) times daily.        . carvedilol (COREG) 12.5 MG tablet Take 1 tablet (12.5 mg total) by mouth 2 (two) times daily with a meal.  180 tablet  3  . Cholecalciferol (VITAMIN D3) 400 UNITS tablet Take 400 Units by mouth daily.        . fenofibrate 160 MG tablet Take 1 tablet (160 mg total) by mouth daily.  90 tablet  3  . Ferrous Sulfate Dried 140 (45 FE) MG TBCR Take 1 tablet by mouth daily.        . furosemide (LASIX) 20 MG tablet Take 1 tablet (20 mg total) by mouth daily. Taking prn  90 tablet  3  . gabapentin (NEURONTIN) 100 MG capsule Take 300 mg by mouth 4 (four) times daily. Stop for 2 weeks      . hydrocodone-acetaminophen (LORCET-HD) 5-500 MG per capsule Take 1 capsule by mouth as needed.        . multivitamin (THERAGRAN) per tablet Take 1 tablet by mouth daily.        .  nitroGLYCERIN (NITROSTAT) 0.4 MG SL tablet Place 1 tablet (0.4 mg total) under the tongue every 5 (five) minutes as needed.  90 tablet  3  . NOVOLIN R RELION 100 UNIT/ML injection 4 in the am and 10 in the pm      . NOVOLOG 100 UNIT/ML injection 40 in am 20 in pm      . omeprazole (PRILOSEC) 20 MG capsule Take 1 capsule (20 mg total) by mouth daily.  90 capsule  3  . pravastatin (PRAVACHOL) 40 MG tablet Take 1 tablet (40 mg total) by mouth daily.  90 tablet  3  . TAMOXIFEN CITRATE PO Take 1 capsule by mouth daily.        . vitamin E 400 UNIT capsule Take 400 Units by mouth daily.         No current facility-administered medications on file prior to visit.    Allergies  Allergen Reactions  . Morphine     Past Medical History  Diagnosis Date  . Hypertension   . Diabetes mellitus   . Hypercholesterolemia   . Bleeding gastric ulcer   . Esophageal  ulcer with bleeding   . Coronary artery disease   . CHF (congestive heart failure)     history of  . History of anemia     chronic  . Anemia   . Arthritis   . Breast cancer, stage 1     bilateral  . Myocardial infarction   . hx: breast cancer, right IDC ER + PR - Her 2 -, left paget's disease/DCIS receptor -  08/12/2011    Patient diagnosed with right breast invasive ductal carcinoma 04/25/2008. Patient underwent right partial mastectomy on 05/21/2008. Pathology showed invasive ductal carcinoma, margins negative. ER positive at 99%. PR negative. Her 2 negative. Ki 67 at 25%. Patient then diagnosed with left nipple paget's disease on 04/01/2010. Patient underwent left lumpectomy on 04/30/2010. Pathology showed paget's dise    Past Surgical History  Procedure Laterality Date  . Coronary artery bypass graft    . Cardiac catheterization  04/01/2003    1. Left heart catheterization. 2. Coronary angiography  3. Left ventricular angiography. 4 . Stenting of the mid left anterior descending.  . Rotator cuff repair    . Breast lumpectomy      right  .  Mastectomy partial / lumpectomy      left  . Abdominal hysterectomy  1966    History  Smoking status  . Former Smoker -- 1.00 packs/day for 20 years  . Types: Cigarettes  . Quit date: 03/29/1964  Smokeless tobacco  . Never Used    History  Alcohol Use No    Family History  Problem Relation Age of Onset  . Heart failure Mother   . Heart disease Mother   . Heart attack Brother   . Coronary artery disease Father   . Heart failure Sister   . Stroke Brother     had bypass surgery    Review of Systems: As noted in history of present illness. All other systems were reviewed and are negative.  Physical Exam: BP 130/60  Pulse 62  Ht 5\' 2"  (1.575 m)  Wt 184 lb (83.462 kg)  BMI 33.65 kg/m2 Jacqueline Zuniga is an overweight white female in no acute distress. HEENT exam is unremarkable.  Sclera are clear. PERRL, oropharynx is clear.  Jacqueline Zuniga has no JVD or bruits. Lungs are clear. Cardiac exam reveals a regular rate and rhythm without gallop, murmur, or click. Abdomen is soft and nontender. Jacqueline Zuniga has no edema. Pedal pulses are good. Jacqueline Zuniga has trace edema. Jacqueline Zuniga is alert oriented x3. Cranial nerves II through XII are intact.  LABORATORY: Ecg shows NSR with old septal infarct. STT changes c/w inferolateral ischemia- this is actually improved from October 2013.  Assessment / Plan: 1. Acute dyspnea. Etiology unclear. No clear CHF by exam. Consider acute PE, infectious process, CHF. Will check labs including CBC, BMET, BNP, and d-dimer. Will check CXR. Further therapy pending results.  2. Coronary disease status post remote stenting of the LAD.  Continue aspirin and carvedilol. Risk factor modification. Will schedule for lexiscan myoview given recent symptoms. Last stress test 5 years ago.  2. Hypertension, improved controlled on amlodipine. Jacqueline Zuniga has a history of cough on ACE inhibitors. Renal insufficiency on Diovan HCT.   3. Diabetes mellitus type 2.  4. Hypercholesterolemia.

## 2013-03-05 NOTE — Patient Instructions (Signed)
We will check blood work today and a Chest Xray  We will schedule you for a Lexiscan myoview study ( nuclear stress test)  Continue your current medication

## 2013-03-06 ENCOUNTER — Other Ambulatory Visit: Payer: Self-pay

## 2013-03-06 DIAGNOSIS — R0600 Dyspnea, unspecified: Secondary | ICD-10-CM

## 2013-03-07 ENCOUNTER — Encounter (HOSPITAL_COMMUNITY): Payer: Self-pay

## 2013-03-07 ENCOUNTER — Ambulatory Visit (HOSPITAL_COMMUNITY)
Admission: RE | Admit: 2013-03-07 | Discharge: 2013-03-07 | Disposition: A | Discharge status: Home / Self Care | Payer: Medicare Other | Source: Ambulatory Visit | Attending: Cardiology | Admitting: Cardiology

## 2013-03-07 ENCOUNTER — Inpatient Hospital Stay (HOSPITAL_COMMUNITY)
Admission: RE | Admit: 2013-03-07 | Discharge: 2013-03-12 | DRG: 176 | Disposition: A | Discharge status: Home / Self Care | Payer: Medicare Other | Source: Ambulatory Visit | Attending: Cardiology | Admitting: Cardiology

## 2013-03-07 VITALS — BP 152/42 | HR 56 | Temp 98.1°F | Resp 18 | Ht 62.0 in | Wt 183.1 lb

## 2013-03-07 DIAGNOSIS — Z86711 Personal history of pulmonary embolism: Secondary | ICD-10-CM | POA: Diagnosis present

## 2013-03-07 DIAGNOSIS — I5022 Chronic systolic (congestive) heart failure: Secondary | ICD-10-CM | POA: Diagnosis present

## 2013-03-07 DIAGNOSIS — Z823 Family history of stroke: Secondary | ICD-10-CM

## 2013-03-07 DIAGNOSIS — Z853 Personal history of malignant neoplasm of breast: Secondary | ICD-10-CM

## 2013-03-07 DIAGNOSIS — I2699 Other pulmonary embolism without acute cor pulmonale: Principal | ICD-10-CM

## 2013-03-07 DIAGNOSIS — Z9010 Acquired absence of unspecified breast and nipple: Secondary | ICD-10-CM

## 2013-03-07 DIAGNOSIS — Z8249 Family history of ischemic heart disease and other diseases of the circulatory system: Secondary | ICD-10-CM

## 2013-03-07 DIAGNOSIS — Z87891 Personal history of nicotine dependence: Secondary | ICD-10-CM

## 2013-03-07 DIAGNOSIS — I129 Hypertensive chronic kidney disease with stage 1 through stage 4 chronic kidney disease, or unspecified chronic kidney disease: Secondary | ICD-10-CM | POA: Diagnosis present

## 2013-03-07 DIAGNOSIS — I824Y9 Acute embolism and thrombosis of unspecified deep veins of unspecified proximal lower extremity: Secondary | ICD-10-CM | POA: Diagnosis present

## 2013-03-07 DIAGNOSIS — E1169 Type 2 diabetes mellitus with other specified complication: Secondary | ICD-10-CM | POA: Diagnosis not present

## 2013-03-07 DIAGNOSIS — Z8719 Personal history of other diseases of the digestive system: Secondary | ICD-10-CM

## 2013-03-07 DIAGNOSIS — R0600 Dyspnea, unspecified: Secondary | ICD-10-CM

## 2013-03-07 DIAGNOSIS — I10 Essential (primary) hypertension: Secondary | ICD-10-CM | POA: Diagnosis present

## 2013-03-07 DIAGNOSIS — D649 Anemia, unspecified: Secondary | ICD-10-CM

## 2013-03-07 DIAGNOSIS — E7800 Pure hypercholesterolemia, unspecified: Secondary | ICD-10-CM | POA: Diagnosis present

## 2013-03-07 DIAGNOSIS — Z79899 Other long term (current) drug therapy: Secondary | ICD-10-CM

## 2013-03-07 DIAGNOSIS — Z794 Long term (current) use of insulin: Secondary | ICD-10-CM

## 2013-03-07 DIAGNOSIS — M129 Arthropathy, unspecified: Secondary | ICD-10-CM | POA: Diagnosis present

## 2013-03-07 DIAGNOSIS — E119 Type 2 diabetes mellitus without complications: Secondary | ICD-10-CM

## 2013-03-07 DIAGNOSIS — I252 Old myocardial infarction: Secondary | ICD-10-CM

## 2013-03-07 DIAGNOSIS — M889 Osteitis deformans of unspecified bone: Secondary | ICD-10-CM | POA: Diagnosis present

## 2013-03-07 DIAGNOSIS — I2510 Atherosclerotic heart disease of native coronary artery without angina pectoris: Secondary | ICD-10-CM

## 2013-03-07 DIAGNOSIS — Z951 Presence of aortocoronary bypass graft: Secondary | ICD-10-CM

## 2013-03-07 DIAGNOSIS — R0609 Other forms of dyspnea: Secondary | ICD-10-CM

## 2013-03-07 DIAGNOSIS — Z7901 Long term (current) use of anticoagulants: Secondary | ICD-10-CM

## 2013-03-07 DIAGNOSIS — N184 Chronic kidney disease, stage 4 (severe): Secondary | ICD-10-CM

## 2013-03-07 DIAGNOSIS — I509 Heart failure, unspecified: Secondary | ICD-10-CM | POA: Diagnosis present

## 2013-03-07 HISTORY — DX: Anemia, unspecified: D64.9

## 2013-03-07 HISTORY — DX: Personal history of other medical treatment: Z92.89

## 2013-03-07 HISTORY — DX: Malignant neoplasm of unspecified site of unspecified female breast: C50.919

## 2013-03-07 HISTORY — DX: Spinal stenosis, lumbar region without neurogenic claudication: M48.061

## 2013-03-07 HISTORY — DX: Long term (current) use of anticoagulants: Z79.01

## 2013-03-07 HISTORY — DX: Unspecified malignant neoplasm of skin of unspecified part of face: C44.300

## 2013-03-07 HISTORY — DX: Acute embolism and thrombosis of unspecified femoral vein: I82.419

## 2013-03-07 HISTORY — DX: Gastro-esophageal reflux disease without esophagitis: K21.9

## 2013-03-07 HISTORY — DX: Chronic kidney disease, unspecified: N18.9

## 2013-03-07 HISTORY — DX: Malignant neoplasm of uterus, part unspecified: C55

## 2013-03-07 HISTORY — DX: Chronic systolic (congestive) heart failure: I50.22

## 2013-03-07 HISTORY — DX: Type 2 diabetes mellitus without complications: E11.9

## 2013-03-07 HISTORY — DX: Other pulmonary embolism without acute cor pulmonale: I26.99

## 2013-03-07 HISTORY — DX: Esophageal obstruction: K22.2

## 2013-03-07 HISTORY — DX: Low back pain: M54.5

## 2013-03-07 HISTORY — DX: Low back pain, unspecified: M54.50

## 2013-03-07 HISTORY — DX: Personal history of other diseases of the digestive system: Z87.19

## 2013-03-07 HISTORY — DX: Other chronic pain: G89.29

## 2013-03-07 LAB — GLUCOSE, CAPILLARY
Glucose-Capillary: 66 mg/dL — ABNORMAL LOW (ref 70–99)
Glucose-Capillary: 70 mg/dL (ref 70–99)

## 2013-03-07 LAB — BASIC METABOLIC PANEL
CO2: 23 mEq/L (ref 19–32)
Calcium: 8.9 mg/dL (ref 8.4–10.5)
Glucose, Bld: 121 mg/dL — ABNORMAL HIGH (ref 70–99)
Potassium: 3.9 mEq/L (ref 3.5–5.1)
Sodium: 139 mEq/L (ref 135–145)

## 2013-03-07 LAB — CBC
HCT: 38.7 % (ref 36.0–46.0)
Hemoglobin: 12.5 g/dL (ref 12.0–15.0)
MCH: 30.3 pg (ref 26.0–34.0)
RBC: 4.12 MIL/uL (ref 3.87–5.11)
WBC: 8 10*3/uL (ref 4.0–10.5)

## 2013-03-07 LAB — HOMOCYSTEINE: Homocysteine: 29.5 umol/L — ABNORMAL HIGH (ref 4.0–15.4)

## 2013-03-07 MED ORDER — FENOFIBRATE 160 MG PO TABS
160.0000 mg | ORAL_TABLET | Freq: Every day | ORAL | Status: DC
  Administered 2013-03-08 – 2013-03-12 (×5): 160 mg via ORAL
  Filled 2013-03-07 (×5): qty 1

## 2013-03-07 MED ORDER — INSULIN ASPART 100 UNIT/ML ~~LOC~~ SOLN
20.0000 [IU] | Freq: Every day | SUBCUTANEOUS | Status: DC
  Administered 2013-03-07: 20 [IU] via SUBCUTANEOUS

## 2013-03-07 MED ORDER — SODIUM CHLORIDE 0.9 % IJ SOLN: 3.0000 mL | INTRAMUSCULAR | Status: DC | PRN

## 2013-03-07 MED ORDER — CARVEDILOL 12.5 MG PO TABS
12.5000 mg | ORAL_TABLET | Freq: Two times a day (BID) | ORAL | Status: DC
  Administered 2013-03-07 – 2013-03-12 (×10): 12.5 mg via ORAL
  Filled 2013-03-07 (×12): qty 1

## 2013-03-07 MED ORDER — ADULT MULTIVITAMIN W/MINERALS CH
1.0000 | ORAL_TABLET | Freq: Every day | ORAL | Status: DC
  Administered 2013-03-08 – 2013-03-12 (×5): 1 via ORAL
  Filled 2013-03-07 (×5): qty 1

## 2013-03-07 MED ORDER — WARFARIN SODIUM 5 MG PO TABS
5.0000 mg | ORAL_TABLET | Freq: Every day | ORAL | Status: DC
  Administered 2013-03-07 – 2013-03-08 (×2): 5 mg via ORAL
  Filled 2013-03-07 (×3): qty 1

## 2013-03-07 MED ORDER — NITROGLYCERIN 0.4 MG SL SUBL: 0.4000 mg | SUBLINGUAL_TABLET | SUBLINGUAL | Status: DC | PRN

## 2013-03-07 MED ORDER — HYDROCODONE-ACETAMINOPHEN 5-325 MG PO TABS
1.0000 | ORAL_TABLET | Freq: Four times a day (QID) | ORAL | Status: DC | PRN
  Administered 2013-03-11: 1 via ORAL
  Filled 2013-03-07: qty 1

## 2013-03-07 MED ORDER — PANTOPRAZOLE SODIUM 40 MG PO TBEC
40.0000 mg | DELAYED_RELEASE_TABLET | Freq: Every day | ORAL | Status: DC
  Administered 2013-03-08 – 2013-03-12 (×5): 40 mg via ORAL
  Filled 2013-03-07 (×4): qty 1

## 2013-03-07 MED ORDER — FERROUS SULFATE 325 (65 FE) MG PO TABS
325.0000 mg | ORAL_TABLET | Freq: Every day | ORAL | Status: DC
  Administered 2013-03-08 – 2013-03-12 (×5): 325 mg via ORAL
  Filled 2013-03-07 (×6): qty 1

## 2013-03-07 MED ORDER — INSULIN ASPART 100 UNIT/ML ~~LOC~~ SOLN
40.0000 [IU] | Freq: Every day | SUBCUTANEOUS | Status: DC
  Administered 2013-03-08: 40 [IU] via SUBCUTANEOUS

## 2013-03-07 MED ORDER — SODIUM CHLORIDE 0.9 % IV SOLN: 250.0000 mL | INTRAVENOUS | Status: DC | PRN

## 2013-03-07 MED ORDER — TAMOXIFEN CITRATE 10 MG PO TABS
20.0000 mg | ORAL_TABLET | Freq: Every day | ORAL | Status: DC
  Administered 2013-03-08 – 2013-03-12 (×5): 20 mg via ORAL
  Filled 2013-03-07 (×5): qty 2

## 2013-03-07 MED ORDER — SODIUM CHLORIDE 0.9 % IJ SOLN
3.0000 mL | Freq: Two times a day (BID) | INTRAMUSCULAR | Status: DC
  Administered 2013-03-07 – 2013-03-12 (×8): 3 mL via INTRAVENOUS

## 2013-03-07 MED ORDER — AMLODIPINE BESYLATE 2.5 MG PO TABS
2.5000 mg | ORAL_TABLET | Freq: Every day | ORAL | Status: DC
  Administered 2013-03-08 – 2013-03-12 (×5): 2.5 mg via ORAL
  Filled 2013-03-07 (×5): qty 1

## 2013-03-07 MED ORDER — FUROSEMIDE 20 MG PO TABS
20.0000 mg | ORAL_TABLET | Freq: Every morning | ORAL | Status: DC
  Administered 2013-03-08 – 2013-03-12 (×5): 20 mg via ORAL
  Filled 2013-03-07 (×5): qty 1

## 2013-03-07 MED ORDER — ACETAMINOPHEN 325 MG PO TABS: 650.0000 mg | ORAL_TABLET | ORAL | Status: DC | PRN

## 2013-03-07 MED ORDER — ONDANSETRON HCL 4 MG/2ML IJ SOLN: 4.0000 mg | Freq: Four times a day (QID) | INTRAMUSCULAR | Status: DC | PRN

## 2013-03-07 MED ORDER — INSULIN ASPART 100 UNIT/ML ~~LOC~~ SOLN
0.0000 [IU] | Freq: Three times a day (TID) | SUBCUTANEOUS | Status: DC
  Administered 2013-03-07: 1 [IU] via SUBCUTANEOUS
  Administered 2013-03-08 – 2013-03-09 (×2): 2 [IU] via SUBCUTANEOUS
  Administered 2013-03-09: 1 [IU] via SUBCUTANEOUS
  Administered 2013-03-10 (×2): 2 [IU] via SUBCUTANEOUS
  Administered 2013-03-11: 1 [IU] via SUBCUTANEOUS
  Administered 2013-03-11 – 2013-03-12 (×2): 2 [IU] via SUBCUTANEOUS
  Administered 2013-03-12: 1 [IU] via SUBCUTANEOUS

## 2013-03-07 MED ORDER — FERROUS SULFATE DRIED ER 140 (45 FE) MG PO TBCR: 1.0000 | EXTENDED_RELEASE_TABLET | Freq: Every day | ORAL | Status: DC

## 2013-03-07 MED ORDER — WARFARIN VIDEO
Freq: Once | Status: AC
  Administered 2013-03-07: 18:00:00

## 2013-03-07 MED ORDER — PATIENT'S GUIDE TO USING COUMADIN BOOK
Freq: Once | Status: AC
  Administered 2013-03-07: 18:00:00
  Filled 2013-03-07: qty 1

## 2013-03-07 MED ORDER — ASPIRIN EC 81 MG PO TBEC
81.0000 mg | DELAYED_RELEASE_TABLET | Freq: Every day | ORAL | Status: DC
  Administered 2013-03-08: 81 mg via ORAL
  Filled 2013-03-07: qty 1

## 2013-03-07 MED ORDER — TECHNETIUM TC 99M DIETHYLENETRIAME-PENTAACETIC ACID: 40.0000 | Freq: Once | INTRAVENOUS | Status: AC | PRN

## 2013-03-07 MED ORDER — WARFARIN - PHARMACIST DOSING INPATIENT: Freq: Every day | Status: DC

## 2013-03-07 MED ORDER — INSULIN NPH (HUMAN) (ISOPHANE) 100 UNIT/ML ~~LOC~~ SUSP
4.0000 [IU] | Freq: Every day | SUBCUTANEOUS | Status: DC
  Filled 2013-03-07: qty 10

## 2013-03-07 MED ORDER — ATORVASTATIN CALCIUM 10 MG PO TABS
10.0000 mg | ORAL_TABLET | Freq: Every day | ORAL | Status: DC
  Administered 2013-03-07 – 2013-03-11 (×5): 10 mg via ORAL
  Filled 2013-03-07 (×6): qty 1

## 2013-03-07 MED ORDER — HEPARIN BOLUS VIA INFUSION
3000.0000 [IU] | Freq: Once | INTRAVENOUS | Status: AC
  Administered 2013-03-07: 3000 [IU] via INTRAVENOUS
  Filled 2013-03-07: qty 3000

## 2013-03-07 MED ORDER — HEPARIN (PORCINE) IN NACL 100-0.45 UNIT/ML-% IJ SOLN
1250.0000 [IU]/h | INTRAMUSCULAR | Status: DC
  Administered 2013-03-07 – 2013-03-08 (×2): 1250 [IU]/h via INTRAVENOUS
  Filled 2013-03-07 (×2): qty 250

## 2013-03-07 MED ORDER — CHOLECALCIFEROL 10 MCG (400 UNIT) PO TABS
400.0000 [IU] | ORAL_TABLET | Freq: Every day | ORAL | Status: DC
  Administered 2013-03-08 – 2013-03-12 (×5): 400 [IU] via ORAL
  Filled 2013-03-07 (×5): qty 1

## 2013-03-07 MED ORDER — TECHNETIUM TO 99M ALBUMIN AGGREGATED
6.0000 | Freq: Once | INTRAVENOUS | Status: AC | PRN
  Administered 2013-03-07: 6 via INTRAVENOUS

## 2013-03-07 MED ORDER — GABAPENTIN 300 MG PO CAPS
300.0000 mg | ORAL_CAPSULE | Freq: Four times a day (QID) | ORAL | Status: DC
  Administered 2013-03-07 – 2013-03-12 (×18): 300 mg via ORAL
  Filled 2013-03-07 (×22): qty 1

## 2013-03-07 MED ORDER — INSULIN NPH (HUMAN) (ISOPHANE) 100 UNIT/ML ~~LOC~~ SUSP
10.0000 [IU] | Freq: Every day | SUBCUTANEOUS | Status: DC
  Filled 2013-03-07: qty 10

## 2013-03-07 NOTE — H&P (Signed)
History and Physical  Patient ID: Jacqueline Zuniga MRN: [id number], DOB: 1925/07/06 Date of Encounter: 03/07/2013, 3:38 PM Primary Physician: Garlan Fillers, MD Primary Cardiologist: [place]  Chief Complaint: SOB Reason for Admission: acute PE  HPI: [name] is an 79 y/o F with history of CAD (LAD stent 03/2003, CABGx2 10/2003), bleeding ulcer remotely, remote breast cancer, chronic systolic CHF who presented to [facility] today for abnormal VQ scan concerning for PE. She was seen in the office recently on 03/05/13 with complaints for SOB and chest pain. Last week she developed pain in her back in the left parasternal region which seemed to improve with Crista Elliot. Several days ago she awoke with acute dyspnea. She also reported mild hemoptysis when she cleared her throat. She also noted some mid sternal chest discomfort that felt like indigestion. She denies fever, chills, aches, or N/V. ECG showed NSR with old septal infarct, STT changes c/w inferolateral ischemia. pBNP was 271, d-dimer was elevated at 1.28. VQ scan today showed intermediate to high probability for PE thus direct admission was arranged. She currently denies any acute complaints. She reports low grade chronic LEE. Denies syncope, CP or acute dyspnea. VSS except O2 sat borderline at 92% RA.  Past Medical History  Diagnosis Date  . Hypertension   . Diabetes mellitus   . Hypercholesterolemia   . Esophageal ulcer with bleeding     a. Anemia 2004: EGD showing a partially healed duodenal ulcer. b. Ulcerative reflux esophagitis and schatzki ring by EGD 2010.  Marland Kitchen Coronary artery disease     a. s/p LAD stent 03/2003. b. Recurrent CP and abnormal nuc s/p CABGx2 (LIMA-LAD, SVG-Diag) in 10/2003. c. Last nuc 2010 reportedly normal per Dr. Elvis Coil note.  . CHF (congestive heart failure)     a. EF 35-40% by echo 2005.  Marland Kitchen History of anemia     chronic  . Arthritis   . Myocardial infarction   . hx: breast cancer, right IDC ER + PR -  Her 2 -, left paget's disease/DCIS receptor -  08/12/2011    Patient diagnosed with right breast invasive ductal carcinoma 04/25/2008. Patient underwent right partial mastectomy on 05/21/2008. Pathology showed invasive ductal carcinoma, margins negative. ER positive at 99%. PR negative. Her 2 negative. Ki 67 at 25%. Patient then diagnosed with left nipple paget's disease on 04/01/2010. Patient underwent left lumpectomy on 04/30/2010. Pathology showed paget's dise  . CKD (chronic kidney disease)    Most Recent Cardiac Studies: 2D Echo 2005 SUMMARY - This was a technically difficult study. - Overall left ventricular systolic function was moderately decreased. Left ventricular ejection fraction was estimated , range being 35 % to 40 %.Marland Kitchen akinesis of the mid-distal anterior wall. moderate hypokinesis of the septal wall. akinesis of the apical wall. - Left atrial size was at the upper limits of normal. - The estimated peak right ventricular systolic pressure was mildly Increased.  Last cath 2005 - see result    Surgical History:  Past Surgical History  Procedure Laterality Date  . Coronary artery bypass graft    . Cardiac catheterization  04/01/2003    1. Left heart catheterization. 2. Coronary angiography  3. Left ventricular angiography. 4 . Stenting of the mid left anterior descending.  . Rotator cuff repair    . Breast lumpectomy      right  . Mastectomy partial / lumpectomy      left  . Abdominal hysterectomy  1966     Home Meds: Prior  to Admission medications   Medication Sig Start Date End Date Taking? Authorizing Provider  amLODipine (NORVASC) 2.5 MG tablet Take 1 tablet (2.5 mg total) by mouth daily. 06/01/12  Yes Peter M [place], MD  aspirin 81 MG tablet Take 162 mg by mouth daily.    Yes Historical Provider, MD  carvedilol (COREG) 12.5 MG tablet Take 1 tablet (12.5 mg total) by mouth 2 (two) times daily with a meal. 06/01/12  Yes Peter M [place], MD  Cholecalciferol (VITAMIN D3) 400  UNITS tablet Take 400 Units by mouth daily.     Yes Historical Provider, MD  fenofibrate 160 MG tablet Take 1 tablet (160 mg total) by mouth daily. 06/01/12  Yes Peter M [place], MD  Ferrous Sulfate Dried 140 (45 FE) MG TBCR Take 1 tablet by mouth daily.     Yes Historical Provider, MD  furosemide (LASIX) 20 MG tablet Take 20 mg by mouth every morning.   Yes Historical Provider, MD  gabapentin (NEURONTIN) 300 MG capsule Take 300 mg by mouth 4 (four) times daily.   Yes Historical Provider, MD  hydrocodone-acetaminophen (LORCET-HD) 5-500 MG per capsule Take 1 capsule by mouth every 6 (six) hours as needed for pain.    Yes Historical Provider, MD  insulin aspart (NOVOLOG) 100 UNIT/ML injection Inject 20-40 Units into the skin 2 (two) times daily. 40 units in a.m. And 20 units in p.m.   Yes Historical Provider, MD  insulin NPH (NOVOLIN N RELION) 100 UNIT/ML injection Inject 4-10 Units into the skin 2 (two) times daily. 4 units in a.m. And 10 units in p.m.   Yes Historical Provider, MD  multivitamin [facility]) per tablet Take 1 tablet by mouth daily.     Yes Historical Provider, MD  nitroGLYCERIN (NITROSTAT) 0.4 MG SL tablet Place 1 tablet (0.4 mg total) under the tongue every 5 (five) minutes as needed. 06/01/12  Yes Peter M [place], MD  omeprazole (PRILOSEC) 20 MG capsule Take 1 capsule (20 mg total) by mouth daily. 06/01/12  Yes Peter M [place], MD  pravastatin (PRAVACHOL) 40 MG tablet Take 1 tablet (40 mg total) by mouth daily. 06/01/12  Yes Peter M [place], MD  tamoxifen (NOLVADEX) 20 MG tablet Take 20 mg by mouth daily.   Yes Historical Provider, MD     Allergies:  Allergies  Allergen Reactions  . Morphine     History   Social History  . Marital Status: Widowed    Spouse Name: N/A    Number of Children: 2  . Years of Education: N/A   Occupational History  . textiles     retired   Social History Main Topics  . Smoking status: Former Smoker -- 1.00 packs/day for 20 years    Types: Cigarettes     Quit date: 03/29/1964  . Smokeless tobacco: Never Used  . Alcohol Use: No  . Drug Use: No  . Sexual Activity: Not on file   Other Topics Concern  . Not on file   Social History Narrative  . No narrative on file     Family History  Problem Relation Age of Onset  . Heart failure Mother   . Heart disease Mother   . Heart attack Brother   . Coronary artery disease Father   . Heart failure Sister   . Stroke Brother     had bypass surgery    Review of Systems: All other systems reviewed and are otherwise negative except as noted above.  Labs:   Lab  Results  Component Value Date   WBC 10.2 03/05/2013   HGB 11.4* 03/05/2013   HCT 34.1* 03/05/2013   MCV 90.9 03/05/2013   PLT 203.0 03/05/2013     Recent Labs Lab 03/05/13 1147  NA 138  K 4.1  CL 105  CO2 24  BUN 51*  CREATININE 2.1*  CALCIUM 8.4  GLUCOSE 289*    Lab Results  Component Value Date   DDIMER 1.28* 03/05/2013    Radiology/Studies:  Dg Chest 2 View 03/07/2013   CLINICAL DATA:  Shortness of breath  EXAM: CHEST  2 VIEW  COMPARISON:  03/05/2013  FINDINGS: Cardiac shadow is stable. Postsurgical changes are again seen. The lungs are clear bilaterally with the exception of mild scarring in the left lung base. Marland Kitchen  IMPRESSION: No active cardiopulmonary disease.   Electronically Signed   By: Alcide Clever M.D.   On: 03/07/2013 12:29   Nm Pulmonary Perf And Vent 03/07/2013   EXAM: NUCLEAR MEDICINE VENTILATION - PERFUSION LUNG SCAN  TECHNIQUE: Ventilation images were obtained in multiple projections using inhaled aerosol technetium 99 M DTPA. Perfusion images were obtained in multiple projections after intravenous injection of Tc-103m MAA.  COMPARISON:  PA lateral chest 03/07/2013.  RADIOPHARMACEUTICALS:  6 mCi Tc-52m DTPA aerosol and 40 mCi Tc-3m MAA  FINDINGS: Ventilation: No focal ventilation defect.  Perfusion: There is a medium to large perfusion defect in the anterior right upper lobe and in the post rib basal  right lower lobe. Left lung perfusion is unremarkable.  IMPRESSION: Intermediate to high probability for pulmonary embolus.  Critical Value/emergent results were called by telephone at the time of interpretation on 03/07/2013 at 12:49 PM to Dr. Hillis Range, who verbally acknowledged these results.   Electronically Signed   By: Drusilla Kanner M.D.   On: 03/07/2013 13:22    EKG: 03/05/13: NSR 61bpm septal infarct age undetermined, inferior TWI I, avL  Physical Exam: Blood pressure 132/45, pulse 59, temperature 98.2 F (36.8 C), temperature source Oral, resp. rate 16, SpO2 92.00%.RA General: Well developed, well nourished, WF in no acute distress. Head: Normocephalic, atraumatic, sclera non-icteric, no xanthomas, nares are without discharge.  Neck: Negative for carotid bruits. JVD not elevated. Lungs: Clear bilaterally to auscultation without wheezes, rales, or rhonchi. Breathing is unlabored. Heart: RRR with S1 S2. No murmurs, rubs, or gallops appreciated. Abdomen: Soft, non-tender, non-distended with normoactive bowel sounds. No hepatomegaly. No rebound/guarding. No obvious abdominal masses. Msk:  Strength and tone appear normal for age. Extremities: No clubbing or cyanosis. Tr edema. Varicose veins present R>L.  Distal pedal pulses are 2+ and equal bilaterally. Neuro: Alert and oriented X 3. No focal deficit. No facial asymmetry. Moves all extremities spontaneously. Psych:  Responds to questions appropriately with a normal affect.   ASSESSMENT AND PLAN:  1. Acute pulmonary embolism  2. CAD s/p remote stent, CABGx2 in 2005 3. Chronic systolic CHF, appears euvolemic 4. CKD stage IV 5. History of remote GIB 6. Diabetes mellitus  Per d/w MD, admit, send for hypercoag panel, check LE dopplers, check 2D echocardiogram. She does not appear to be on ACEI likely due to h/o CKD, will leave off for now. Initially we considered Xarelto. However, this is contraindicated for PE in patients with CrCl  <30. In the setting of this & prior history of GIB, we have chosen heparin ->Coumadin for now. We will continue PPI and monitor closely for bleeding. Will defer to rounding MD to decide about continuation of ASA once anticoag is on  board. Otherwise will continue preadmission medications.  Signed, Ronie Spies PA-C 03/07/2013, 3:38 PM  Patient seen and examined and history reviewed. Agree with above findings and plan. Pleasant 79 yo WF seen by me in the office on Monday with symptoms noted above. CXR was nonacute. D-dimer was mildly elevated. Because of her renal dysfunction we did a V/Q scan today which was high probability for PE. Patient is admitted for anticoagulation. Because of renal dysfunction stage 4 she is not a candidate for Xarelto so she will be started on heparin and transitioned to coumadin. Will check Echo, LE venous dopplers and hypercoagulable panel.  Theron Arista [facility] 03/07/2013 6:17 PM  '

## 2013-03-07 NOTE — Progress Notes (Signed)
ANTICOAGULATION CONSULT NOTE - Initial Consult  Pharmacy Consult for Heparin / Coumadin Indication: pulmonary embolus  Allergies  Allergen Reactions  . Morphine     Patient Measurements: Height: 5\' 2"  (157.5 cm) Weight: 184 lb (83.462 kg) IBW/kg (Calculated) : 50.1  Vital Signs: Temp: 98.2 F (36.8 C) (12/10 1533) Temp src: Oral (12/10 1533) BP: 132/45 mmHg (12/10 1533) Pulse Rate: 59 (12/10 1533)  Labs:  Recent Labs  03/05/13 1147 03/07/13 1620  HGB 11.4* 12.5  HCT 34.1* 38.7  PLT 203.0 279  CREATININE 2.1* 1.78*    Estimated Creatinine Clearance: 23.2 ml/min (by C-G formula based on Cr of 1.78).   Medical History: Past Medical History  Diagnosis Date  . Hypertension   . Diabetes mellitus   . Hypercholesterolemia   . H/O: GI bleed     a. Anemia 2004: EGD showing a partially healed duodenal ulcer. b. Ulcerative reflux esophagitis and schatzki ring by EGD 2010.  Marland Kitchen Coronary artery disease     a. s/p LAD stent 03/2003. b. Recurrent CP and abnormal nuc s/p CABGx2 (LIMA-LAD, SVG-Diag) in 10/2003. c. Last nuc 2010 reportedly normal per Dr. Elvis Coil note.  . Chronic systolic CHF (congestive heart failure)     a. EF 35-40% by echo 2005.  Marland Kitchen History of anemia     chronic  . Arthritis   . Myocardial infarction   . hx: breast cancer, right IDC ER + PR - Her 2 -, left paget's disease/DCIS receptor -  08/12/2011    Patient diagnosed with right breast invasive ductal carcinoma 04/25/2008. Patient underwent right partial mastectomy on 05/21/2008. Pathology showed invasive ductal carcinoma, margins negative. ER positive at 99%. PR negative. Her 2 negative. Ki 67 at 25%. Patient then diagnosed with left nipple paget's disease on 04/01/2010. Patient underwent left lumpectomy on 04/30/2010. Pathology showed paget's dise  . CKD (chronic kidney disease)     Assessment: 79 year old female with a history of CAD, remote breast cancer, CHF, CKD stage 5  Presented to [facility] with a new PE.   Pharmacy asked to begin IV heparin and Coumadin.  Xarelto is contraindicated for her due to her renal dysfunction.  Goal of Therapy:  Heparin level 0.3-0.7 units/ml Monitor platelets by anticoagulation protocol: Yes INR = 2 to 3   Plan:  1) Heparin 3000 units iv bolus x 1 2) Heparin drip at 1250 units / hr 3) Coumadin 5 mg po daily at 1800 pm 4) Heparin level 8 hours after heparin begins 5) Daily heparin level, CBC, INR  Thank you. Okey Regal, PharmD [phone]  03/07/2013,5:46 PM

## 2013-03-08 DIAGNOSIS — I369 Nonrheumatic tricuspid valve disorder, unspecified: Secondary | ICD-10-CM

## 2013-03-08 DIAGNOSIS — I824Y9 Acute embolism and thrombosis of unspecified deep veins of unspecified proximal lower extremity: Secondary | ICD-10-CM

## 2013-03-08 DIAGNOSIS — I2699 Other pulmonary embolism without acute cor pulmonale: Secondary | ICD-10-CM

## 2013-03-08 DIAGNOSIS — D649 Anemia, unspecified: Secondary | ICD-10-CM

## 2013-03-08 DIAGNOSIS — R0602 Shortness of breath: Secondary | ICD-10-CM

## 2013-03-08 LAB — HEPARIN LEVEL (UNFRACTIONATED)
Heparin Unfractionated: 0.48 IU/mL (ref 0.30–0.70)
Heparin Unfractionated: 0.82 IU/mL — ABNORMAL HIGH (ref 0.30–0.70)

## 2013-03-08 LAB — GLUCOSE, CAPILLARY
Glucose-Capillary: 98 mg/dL (ref 70–99)
Glucose-Capillary: 37 mg/dL — CL (ref 70–99)
Glucose-Capillary: 61 mg/dL — ABNORMAL LOW (ref 70–99)
Glucose-Capillary: 69 mg/dL — ABNORMAL LOW (ref 70–99)
Glucose-Capillary: 107 mg/dL — ABNORMAL HIGH (ref 70–99)
Glucose-Capillary: 162 mg/dL — ABNORMAL HIGH (ref 70–99)
Glucose-Capillary: 217 mg/dL — ABNORMAL HIGH (ref 70–99)

## 2013-03-08 LAB — BASIC METABOLIC PANEL
BUN: 45 mg/dL — ABNORMAL HIGH (ref 6–23)
CO2: 21 mEq/L (ref 19–32)
Calcium: 8.7 mg/dL (ref 8.4–10.5)
Chloride: 105 mEq/L (ref 96–112)
Creatinine, Ser: 1.6 mg/dL — ABNORMAL HIGH (ref 0.50–1.10)
Glucose, Bld: 148 mg/dL — ABNORMAL HIGH (ref 70–99)

## 2013-03-08 LAB — CBC
HCT: 35.7 % — ABNORMAL LOW (ref 36.0–46.0)
Hemoglobin: 11.5 g/dL — ABNORMAL LOW (ref 12.0–15.0)
MCH: 30 pg (ref 26.0–34.0)
MCV: 93.2 fL (ref 78.0–100.0)
Platelets: 245 10*3/uL (ref 150–400)
RBC: 3.83 MIL/uL — ABNORMAL LOW (ref 3.87–5.11)
WBC: 6.9 10*3/uL (ref 4.0–10.5)

## 2013-03-08 LAB — LUPUS ANTICOAGULANT PANEL: PTT Lupus Anticoagulant: 29.1 secs (ref 28.0–43.0)

## 2013-03-08 LAB — FACTOR 5 LEIDEN

## 2013-03-08 LAB — PROTEIN C ACTIVITY: Protein C Activity: 122 % (ref 75–133)

## 2013-03-08 MED ORDER — INSULIN NPH (HUMAN) (ISOPHANE) 100 UNIT/ML ~~LOC~~ SUSP
4.0000 [IU] | Freq: Every day | SUBCUTANEOUS | Status: DC
  Administered 2013-03-08: 4 [IU] via SUBCUTANEOUS

## 2013-03-08 MED ORDER — GLUCAGON HCL (RDNA) 1 MG IJ SOLR: 1.0000 mg | Freq: Once | INTRAMUSCULAR | Status: AC | PRN

## 2013-03-08 MED ORDER — DEXTROSE 5 % IV SOLN
INTRAVENOUS | Status: DC
  Administered 2013-03-08: 14:00:00 via INTRAVENOUS

## 2013-03-08 MED ORDER — DEXTROSE 50 % IV SOLN: 25.0000 mL | Freq: Once | INTRAVENOUS | Status: AC | PRN

## 2013-03-08 MED ORDER — GLUCOSE 40 % PO GEL
1.0000 | ORAL | Status: DC | PRN
  Administered 2013-03-08: 37.5 g via ORAL

## 2013-03-08 MED ORDER — INSULIN ASPART PROT & ASPART (70-30 MIX) 100 UNIT/ML ~~LOC~~ SUSP: 20.0000 [IU] | Freq: Every day | SUBCUTANEOUS | Status: DC

## 2013-03-08 MED ORDER — INSULIN NPH (HUMAN) (ISOPHANE) 100 UNIT/ML ~~LOC~~ SUSP
32.0000 [IU] | Freq: Every day | SUBCUTANEOUS | Status: DC
  Administered 2013-03-09: 32 [IU] via SUBCUTANEOUS
  Filled 2013-03-08: qty 10

## 2013-03-08 MED ORDER — GLUCAGON HCL (RDNA) 1 MG IJ SOLR: 0.5000 mg | Freq: Once | INTRAMUSCULAR | Status: AC | PRN

## 2013-03-08 MED ORDER — INSULIN NPH (HUMAN) (ISOPHANE) 100 UNIT/ML ~~LOC~~ SUSP: 4.0000 [IU] | Freq: Every day | SUBCUTANEOUS | Status: DC

## 2013-03-08 MED ORDER — INSULIN NPH (HUMAN) (ISOPHANE) 100 UNIT/ML ~~LOC~~ SUSP
16.0000 [IU] | Freq: Every day | SUBCUTANEOUS | Status: DC
  Administered 2013-03-08 – 2013-03-11 (×4): 16 [IU] via SUBCUTANEOUS
  Filled 2013-03-08: qty 10

## 2013-03-08 MED ORDER — HEPARIN (PORCINE) IN NACL 100-0.45 UNIT/ML-% IJ SOLN
1100.0000 [IU]/h | INTRAMUSCULAR | Status: DC
  Filled 2013-03-08 (×3): qty 250

## 2013-03-08 MED ORDER — INSULIN ASPART PROT & ASPART (70-30 MIX) 100 UNIT/ML ~~LOC~~ SUSP
40.0000 [IU] | Freq: Every day | SUBCUTANEOUS | Status: DC
  Filled 2013-03-08: qty 10

## 2013-03-08 MED ORDER — GLUCOSE 40 % PO GEL
ORAL | Status: AC
  Administered 2013-03-08: 37.5 g via ORAL
  Filled 2013-03-08: qty 1

## 2013-03-08 NOTE — Care Management Note (Signed)
    Page 1 of 1   03/12/2013     11:46:37 AM   CARE MANAGEMENT NOTE 03/12/2013  Patient:  DARNESHA, DILORETO   Account Number:  [id number]  Date Initiated:  03/08/2013  Documentation initiated by:  GRAVES-BIGELOW,Maudine Kluesner  Subjective/Objective Assessment:   Pt admitted for SOB- acute PE. Initiated on Iv Heparin gtt. Pt has a female friend that stays with her as needed. Pt has DME: RW and cane.     Action/Plan:   CM will continue to monitor for disposition needs.   Anticipated DC Date:  03/10/2013   Anticipated DC Plan:  HOME/SELF CARE      DC Planning Services  CM consult      Choice offered to / List presented to:             Status of service:  Completed, signed off Medicare Important Message given?   (If response is "NO", the following Medicare IM given date fields will be blank) Date Medicare IM given:   Date Additional Medicare IM given:    Discharge Disposition:  HOME/SELF CARE  Per UR Regulation:  Reviewed for med. necessity/level of care/duration of stay  If discussed at Long Length of Stay Meetings, dates discussed:    Comments:

## 2013-03-08 NOTE — Progress Notes (Signed)
ANTICOAGULATION CONSULT NOTE - Follow up Consult  Pharmacy Consult for Heparin / Coumadin Indication: pulmonary embolus with left common femoral DVT   Allergies  Allergen Reactions  . Morphine     Patient Measurements: Height: 5\' 2"  (157.5 cm) Weight: 185 lb 14.4 oz (84.324 kg) IBW/kg (Calculated) : 50.1 Heparin dosing weight: 69 kg  Vital Signs: Temp: 97.7 F (36.5 C) (12/11 1400) Temp src: Oral (12/11 1400) BP: 121/50 mmHg (12/11 1400) Pulse Rate: 56 (12/11 1400)  Labs:  Recent Labs  03/07/13 1620 03/08/13 0605 03/08/13 1440  HGB 12.5 11.5*  --   HCT 38.7 35.7*  --   PLT 279 245  --   LABPROT  --  15.1  --   INR  --  1.22  --   HEPARINUNFRC  --  0.48 0.82*  CREATININE 1.78* 1.60*  --     Estimated Creatinine Clearance: 25.9 ml/min (by C-G formula based on Cr of 1.6).   Assessment: The 2nd heparin level has increased to 0.82 on 1250 units/hr IV heparin infusion in this 79 year old female on anticoagulation for new PE with DVT in left common femoral vein.  No bleeding noted. She has a history of CAD, remote breast cancer, CHF, CKD stage 5, and GI bleed.    Xarelto is contraindicated for her due to her renal dysfunction.  Hypercoaguable panel pending   Goal of Therapy:  Heparin level 0.3-0.7 units/ml Monitor platelets by anticoagulation protocol: Yes INR = 2 to 3   Plan:  Decrease heparin drip to 1100 units / hr Check 6 hr HL Daily heparin level, CBC, INR  Noah Delaine, RPh Clinical Pharmacist Pager: [phone] 03/08/2013, 4:10 PM

## 2013-03-08 NOTE — Progress Notes (Signed)
VASCULAR LAB PRELIMINARY  PRELIMINARY  PRELIMINARY  PRELIMINARY  Bilateral lower extremity venous Dopplers completed.    Preliminary report:  There is acute DVT noted in the left common femoral vein.  All other veins appear thrombus free.  Valerio Pinard, RVT 03/08/2013, 9:57 AM

## 2013-03-08 NOTE — Progress Notes (Signed)
ANTICOAGULATION CONSULT NOTE - Follow up Consult  Pharmacy Consult for Heparin / Coumadin Indication: pulmonary embolus  Allergies  Allergen Reactions  . Morphine     Patient Measurements: Height: 5\' 2"  (157.5 cm) Weight: 185 lb 14.4 oz (84.324 kg) IBW/kg (Calculated) : 50.1  Vital Signs: Temp: 99 F (37.2 C) (12/11 0434) Temp src: Oral (12/11 0434) BP: 136/51 mmHg (12/11 0434) Pulse Rate: 57 (12/11 0434)  Labs:  Recent Labs  03/05/13 1147 03/07/13 1620 03/08/13 0605  HGB 11.4* 12.5 11.5*  HCT 34.1* 38.7 35.7*  PLT 203.0 279 245  LABPROT  --   --  15.1  INR  --   --  1.22  HEPARINUNFRC  --   --  0.48  CREATININE 2.1* 1.78* 1.60*    Estimated Creatinine Clearance: 25.9 ml/min (by C-G formula based on Cr of 1.6).   Assessment: 79 year old female with a history of CAD, remote breast cancer, CHF, CKD stage 5  Presented to [facility] with a new PE.  Currently on IV heparin and Coumadin. H/H stable at 11.5/35.7, Plt wnl. Educated patient on coumadin today. No bleeding noted.   Xarelto is contraindicated for her due to her renal dysfunction.  Goal of Therapy:  Heparin level 0.3-0.7 units/ml Monitor platelets by anticoagulation protocol: Yes INR = 2 to 3   Plan:  1) Continue heparin drip at 1250 units / hr 2) Coumadin 5 mg po daily at 1800 pm 3) F/u confirmatory 8 hr HL 4) Daily heparin level, CBC, INR  Vinnie Level, PharmD.  Clinical Pharmacist Pager [phone]

## 2013-03-08 NOTE — Progress Notes (Signed)
TELEMETRY: Reviewed telemetry pt in NSR: Filed Vitals:   03/07/13 1609 03/07/13 1745 03/07/13 2034 03/08/13 0434  BP:  122/48 133/50 136/51  Pulse:  62 61 57  Temp:   98.6 F (37 C) 99 F (37.2 C)  TempSrc:   Oral Oral  Resp:   16 16  Height: 5\' 2"  (1.575 m)     Weight: 184 lb (83.462 kg)   185 lb 14.4 oz (84.324 kg)  SpO2:   91% 92%    Intake/Output Summary (Last 24 hours) at 03/08/13 1238 Last data filed at 03/08/13 0900  Gross per 24 hour  Intake    369 ml  Output      0 ml  Net    369 ml    SUBJECTIVE Feels OK. Still has some SOB. No cough or hemoptysis.  LABS: Basic Metabolic Panel:  Recent Labs  16/10/96 1620 03/08/13 0605  NA 139 140  K 3.9 4.4  CL 102 105  CO2 23 21  GLUCOSE 121* 148*  BUN 46* 45*  CREATININE 1.78* 1.60*  CALCIUM 8.9 8.7   Liver Function Tests: No results found for this basename: AST, ALT, ALKPHOS, BILITOT, PROT, ALBUMIN,  in the last 72 hours No results found for this basename: LIPASE, AMYLASE,  in the last 72 hours CBC:  Recent Labs  03/07/13 1620 03/08/13 0605  WBC 8.0 6.9  HGB 12.5 11.5*  HCT 38.7 35.7*  MCV 93.9 93.2  PLT 279 245   Cardiac Enzymes: No results found for this basename: CKTOTAL, CKMB, CKMBINDEX, TROPONINI,  in the last 72 hours  BNP: 271  D-Dimer: 1.28 Thyroid Function Tests: No results found for this basename: TSH, T4TOTAL, FREET3, T3FREE, THYROIDAB,  in the last 72 hours  Radiology/Studies:  Dg Chest 2 View  03/07/2013   CLINICAL DATA:  Shortness of breath  EXAM: CHEST  2 VIEW  COMPARISON:  03/05/2013  FINDINGS: Cardiac shadow is stable. Postsurgical changes are again seen. The lungs are clear bilaterally with the exception of mild scarring in the left lung base. Marland Kitchen  IMPRESSION: No active cardiopulmonary disease.   Electronically Signed   By: Alcide Clever M.D.   On: 03/07/2013 12:29   Dg Chest 2 View  03/05/2013   CLINICAL DATA:  Shortness of breath.  EXAM: CHEST  2 VIEW  COMPARISON:  05/20/2008   FINDINGS: Prior CABG.  Mild cardiomegaly.  Mild bibasilar scarring.  Mild thoracic spondylosis.  No edema.  IMPRESSION: 1. Mild cardiomegaly, without edema. 2. Stable bibasilar scarring.   Electronically Signed   By: Herbie Baltimore M.D.   On: 03/05/2013 14:45   Nm Pulmonary Perf And Vent  03/07/2013   EXAM: NUCLEAR MEDICINE VENTILATION - PERFUSION LUNG SCAN  TECHNIQUE: Ventilation images were obtained in multiple projections using inhaled aerosol technetium 99 M DTPA. Perfusion images were obtained in multiple projections after intravenous injection of Tc-76m MAA.  COMPARISON:  PA lateral chest 03/07/2013.  RADIOPHARMACEUTICALS:  6 mCi Tc-86m DTPA aerosol and 40 mCi Tc-75m MAA  FINDINGS: Ventilation: No focal ventilation defect.  Perfusion: There is a medium to large perfusion defect in the anterior right upper lobe and in the post rib basal right lower lobe. Left lung perfusion is unremarkable.  IMPRESSION: Intermediate to high probability for pulmonary embolus.  Critical Value/emergent results were called by telephone at the time of interpretation on 03/07/2013 at 12:49 PM to Dr. Hillis Range, who verbally acknowledged these results.   Electronically Signed   By: Drusilla Kanner  M.D.   On: 03/07/2013 13:22   LE venous doppler: DVT in left common femoral vein.  Echo:Study Conclusions  - Left ventricle: Inferobasal hypokinesis The cavity size was mildly dilated. Wall thickness was normal. Systolic function was normal. The estimated ejection fraction was in the range of 50% to 55%. - Mitral valve: Calcified annulus. Mildly thickened leaflets . - Left atrium: The atrium was mildly dilated. - Right atrium: The atrium was mildly dilated. - Atrial septum: No defect or patent foramen ovale was identified.   PHYSICAL EXAM General: Well developed, obese, in no acute distress. Head: Normal Neck: Negative for carotid bruits. JVD not elevated. Lungs: Clear bilaterally to auscultation without  wheezes, rales, or rhonchi. Breathing is unlabored. Heart: RRR S1 S2 without murmurs, rubs, or gallops.  Abdomen: Soft, non-tender, non-distended with normoactive bowel sounds. No hepatomegaly. No rebound/guarding. No obvious abdominal masses. Msk:  Strength and tone appears normal for age. Extremities: trace edema. No phlebitis.  Distal pedal pulses are 2+ and equal bilaterally. Neuro: Alert and oriented X 3. Moves all extremities spontaneously. Psych:  Responds to questions appropriately with a normal affect.  ASSESSMENT AND PLAN: 1. Acute pulmonary embolus with left common femoral DVT. No right heart enlargement by Echo. Oxygenating well. Unfortunately she is not a candidate for Xarelto due to CKD. Currently on IV heparin. Coumadin started. Per pharmacy. hypercoaguable panel pending. Will DC ASA.  2.CAD s/p CABG.   3. CKD stage IV  4. DM   5. Remote GI bleed.  Active Problems:   Acute pulmonary embolism   Diabetes mellitus   Chronic systolic CHF (congestive heart failure)   Coronary artery disease   H/O: GI bleed   CKD (chronic kidney disease), stage IV    Signed, Noelly Lasseigne [place] MD,FACC 03/08/2013 12:44 PM

## 2013-03-08 NOTE — Progress Notes (Signed)
Echo Lab  2D Echocardiogram completed.  Saamir Armstrong L Sophia Sperry, RDCS 03/08/2013 8:38 AM

## 2013-03-08 NOTE — Progress Notes (Signed)
Inpatient Diabetes Program Recommendations  AACE/ADA: New Consensus Statement on Inpatient Glycemic Control (2013)  Target Ranges:  Prepandial:   less than 140 mg/dL      Peak postprandial:   less than 180 mg/dL (1-2 hours)      Critically ill patients:  140 - 180 mg/dL   Reason for Visit: Hypoglycemia  79 year old female with hx DM admitted with SOB - acute PE. Pt states Dr. Jarold Motto manages her diabetes in the outpatient setting and recently adjusted her pm dose of Novolin N from 25 units to 20 units to prevent nighttime hypoglycemia.  States blood sugars have been normal with no hypoglycemia for past few weeks.  Home meds verified with [facility] pharmacy in [place].  Hypoglycemia (37, 61) this am likely d/t Novolog 40 units given at 0910. Discussed with RN and NP Thayer Ohm. Would recommend 80% of home basal insulin to begin acDinner today.  Inpatient Diabetes Program Recommendations Insulin - Basal: NPH 32 units QAM and 16 units ac Dinner (Pt on 40 in am and 20 ac dinner at home) Correction (SSI): Novolog sensitive tidwc HgbA1C: Check HgbA1C to assess glycemic control prior to hospitalization Diet: CHO mod med  Note: Will f/u in am for insulin recommendations with am blood sugars.  Thank you. Ailene Ards, RD, LDN, CDE Inpatient Diabetes Coordinator [phone]

## 2013-03-09 LAB — CBC
HCT: 35.3 % — ABNORMAL LOW (ref 36.0–46.0)
Hemoglobin: 11.3 g/dL — ABNORMAL LOW (ref 12.0–15.0)
MCH: 29.8 pg (ref 26.0–34.0)
MCHC: 32 g/dL (ref 30.0–36.0)
MCV: 93.1 fL (ref 78.0–100.0)
RDW: 14.2 % (ref 11.5–15.5)
WBC: 7.1 10*3/uL (ref 4.0–10.5)

## 2013-03-09 LAB — GLUCOSE, CAPILLARY
Glucose-Capillary: 112 mg/dL — ABNORMAL HIGH (ref 70–99)
Glucose-Capillary: 169 mg/dL — ABNORMAL HIGH (ref 70–99)
Glucose-Capillary: 143 mg/dL — ABNORMAL HIGH (ref 70–99)
Glucose-Capillary: 206 mg/dL — ABNORMAL HIGH (ref 70–99)

## 2013-03-09 LAB — PROTEIN C, TOTAL: Protein C, Total: 80 % (ref 72–160)

## 2013-03-09 LAB — BETA-2-GLYCOPROTEIN I ABS, IGG/M/A
Beta-2 Glyco I IgG: 3 G Units (ref <20)
Beta-2-Glycoprotein I IgA: 6 A Units (ref <20)
Beta-2-Glycoprotein I IgM: 8 M Units (ref <20)

## 2013-03-09 LAB — PROTEIN S, TOTAL: Protein S Ag, Total: 134 % (ref 60–150)

## 2013-03-09 LAB — PROTIME-INR
INR: 2.31 — ABNORMAL HIGH (ref 0.00–1.49)
Prothrombin Time: 24.6 seconds — ABNORMAL HIGH (ref 11.6–15.2)

## 2013-03-09 LAB — HEPARIN LEVEL (UNFRACTIONATED): Heparin Unfractionated: 0.4 IU/mL (ref 0.30–0.70)

## 2013-03-09 LAB — CARDIOLIPIN ANTIBODIES, IGG, IGM, IGA: Anticardiolipin IgG: 19 GPL U/mL (ref <23)

## 2013-03-09 MED ORDER — WARFARIN SODIUM 1 MG PO TABS
1.0000 mg | ORAL_TABLET | Freq: Once | ORAL | Status: AC
  Administered 2013-03-09: 1 mg via ORAL
  Filled 2013-03-09: qty 1

## 2013-03-09 MED ORDER — INSULIN NPH (HUMAN) (ISOPHANE) 100 UNIT/ML ~~LOC~~ SUSP
26.0000 [IU] | Freq: Every day | SUBCUTANEOUS | Status: DC
  Administered 2013-03-10 – 2013-03-12 (×3): 26 [IU] via SUBCUTANEOUS
  Filled 2013-03-09: qty 10

## 2013-03-09 NOTE — Progress Notes (Signed)
ANTICOAGULATION CONSULT NOTE - Follow Up Consult  Pharmacy Consult for Heparin/Coumadin Indication: pulmonary embolus and DVT  Allergies  Allergen Reactions  . Morphine    Patient Measurements: Height: 5\' 2"  (157.5 cm) Weight: 185 lb 6.5 oz (84.1 kg) IBW/kg (Calculated) : 50.1 Heparin Dosing Weight: 69 kg  Vital Signs: Temp: 98.7 F (37.1 C) (12/12 0500) Temp src: Oral (12/12 0500) BP: 130/44 mmHg (12/12 0500) Pulse Rate: 61 (12/12 0500)  Labs:  Recent Labs  03/07/13 1620  03/08/13 0605 03/08/13 1440 03/09/13 0015 03/09/13 0610  HGB 12.5  --  11.5*  --   --  11.3*  HCT 38.7  --  35.7*  --   --  35.3*  PLT 279  --  245  --   --  295  LABPROT  --   --  15.1  --   --  24.6*  INR  --   --  1.22  --   --  2.31*  HEPARINUNFRC  --   < > 0.48 0.82* 0.44 0.40  CREATININE 1.78*  --  1.60*  --   --   --   < > = values in this interval not displayed. Estimated Creatinine Clearance: 25.9 ml/min (by C-G formula based on Cr of 1.6).  Medications:  . dextrose 10 mL/hr at 03/08/13 1400  . heparin 1,100 Units/hr (03/08/13 1806)    Assessment: 79 year old female receiving Heparin bridging to Coumadin for new DVT/PE.  Her INR had a sharp increase after only 2 doses of Coumadin.  Although her INR is technically within the therapeutic range this does not reflect adequate anticoagulation this early in her course.  A minimum of 5 days of overlap therapy is recommended.  Her heparin level remains therapeutic.  Goal of Therapy:  Heparin level 0.3-0.7 units/ml Monitor platelets by anticoagulation protocol: Yes   Plan: Continue Heparin at 1100 units/hr Decrease Coumadin to 1mg  today Daily heparin level, CBC, PT/INR  Estella Husk, Pharm.D., BCPS, AAHIVP Clinical Pharmacist Phone: [phone] or [phone] 03/09/2013, 10:26 AM

## 2013-03-09 NOTE — Progress Notes (Signed)
ANTICOAGULATION CONSULT NOTE - Follow Up Consult  Pharmacy Consult for Heparin  Indication: pulmonary embolus and DVT  Allergies  Allergen Reactions  . Morphine    Patient Measurements: Height: 5\' 2"  (157.5 cm) Weight: 185 lb 14.4 oz (84.324 kg) IBW/kg (Calculated) : 50.1 Heparin Dosing Weight: 69 kg  Vital Signs: Temp: 98.6 F (37 C) (12/11 2157) Temp src: Oral (12/11 2157) BP: 149/48 mmHg (12/11 2157) Pulse Rate: 63 (12/11 2157)  Labs:  Recent Labs  03/07/13 1620 03/08/13 0605 03/08/13 1440 03/09/13 0015  HGB 12.5 11.5*  --   --   HCT 38.7 35.7*  --   --   PLT 279 245  --   --   LABPROT  --  15.1  --   --   INR  --  1.22  --   --   HEPARINUNFRC  --  0.48 0.82* 0.44  CREATININE 1.78* 1.60*  --   --    Estimated Creatinine Clearance: 25.9 ml/min (by C-G formula based on Cr of 1.6).  Medications:  Heparin 1100 units/r  Assessment: 79 y/o F on heparin/warfarin for new PE/DVT. Repeat HL after rate decrease is 0.44. Other labs as above.  Goal of Therapy:  Heparin level 0.3-0.7 units/ml Monitor platelets by anticoagulation protocol: Yes   Plan: -Continue heparin drip at 1100 units -Confirmatory HL with AM labs -Daily CBC/HL -Monitor for bleeding -Warfarin per previous note  Thank you for allowing me to take part in this patient's care,  Abran Duke, PharmD Clinical Pharmacist Phone: [phone] Pager: [phone] 03/09/2013 1:20 AM

## 2013-03-09 NOTE — Progress Notes (Signed)
TELEMETRY: Reviewed telemetry pt in NSR: Filed Vitals:   03/08/13 0434 03/08/13 1400 03/08/13 2157 03/09/13 0500  BP: 136/51 121/50 149/48 130/44  Pulse: 57 56 63 61  Temp: 99 F (37.2 C) 97.7 F (36.5 C) 98.6 F (37 C) 98.7 F (37.1 C)  TempSrc: Oral Oral Oral Oral  Resp: 16 18 20 20   Height:      Weight: 185 lb 14.4 oz (84.324 kg)   185 lb 6.5 oz (84.1 kg)  SpO2: 92% 92% 95% 91%    Intake/Output Summary (Last 24 hours) at 03/09/13 0855 Last data filed at 03/08/13 1800  Gross per 24 hour  Intake    960 ml  Output      0 ml  Net    960 ml    SUBJECTIVE Feels OK. A little shaky. No cough or hemoptysis. Breathing is better.  LABS: Basic Metabolic Panel:  Recent Labs  08/65/78 1620 03/08/13 0605  NA 139 140  K 3.9 4.4  CL 102 105  CO2 23 21  GLUCOSE 121* 148*  BUN 46* 45*  CREATININE 1.78* 1.60*  CALCIUM 8.9 8.7   CBC:  Recent Labs  03/08/13 0605 03/09/13 0610  WBC 6.9 7.1  HGB 11.5* 11.3*  HCT 35.7* 35.3*  MCV 93.2 93.1  PLT 245 295    BNP: 271  D-Dimer: 1.28  Radiology/Studies:  Dg Chest 2 View  03/07/2013   CLINICAL DATA:  Shortness of breath  EXAM: CHEST  2 VIEW  COMPARISON:  03/05/2013  FINDINGS: Cardiac shadow is stable. Postsurgical changes are again seen. The lungs are clear bilaterally with the exception of mild scarring in the left lung base. Marland Kitchen  IMPRESSION: No active cardiopulmonary disease.   Electronically Signed   By: Alcide Clever M.D.   On: 03/07/2013 12:29   Dg Chest 2 View  03/05/2013   CLINICAL DATA:  Shortness of breath.  EXAM: CHEST  2 VIEW  COMPARISON:  05/20/2008  FINDINGS: Prior CABG.  Mild cardiomegaly.  Mild bibasilar scarring.  Mild thoracic spondylosis.  No edema.  IMPRESSION: 1. Mild cardiomegaly, without edema. 2. Stable bibasilar scarring.   Electronically Signed   By: Herbie Baltimore M.D.   On: 03/05/2013 14:45   Nm Pulmonary Perf And Vent  03/07/2013   EXAM: NUCLEAR MEDICINE VENTILATION - PERFUSION LUNG SCAN   TECHNIQUE: Ventilation images were obtained in multiple projections using inhaled aerosol technetium 99 M DTPA. Perfusion images were obtained in multiple projections after intravenous injection of Tc-69m MAA.  COMPARISON:  PA lateral chest 03/07/2013.  RADIOPHARMACEUTICALS:  6 mCi Tc-33m DTPA aerosol and 40 mCi Tc-12m MAA  FINDINGS: Ventilation: No focal ventilation defect.  Perfusion: There is a medium to large perfusion defect in the anterior right upper lobe and in the post rib basal right lower lobe. Left lung perfusion is unremarkable.  IMPRESSION: Intermediate to high probability for pulmonary embolus.  Critical Value/emergent results were called by telephone at the time of interpretation on 03/07/2013 at 12:49 PM to Dr. Hillis Range, who verbally acknowledged these results.   Electronically Signed   By: Drusilla Kanner M.D.   On: 03/07/2013 13:22   LE venous doppler: DVT in left common femoral vein.  Echo:Study Conclusions  - Left ventricle: Inferobasal hypokinesis The cavity size was mildly dilated. Wall thickness was normal. Systolic function was normal. The estimated ejection fraction was in the range of 50% to 55%. - Mitral valve: Calcified annulus. Mildly thickened leaflets . - Left atrium: The atrium  was mildly dilated. - Right atrium: The atrium was mildly dilated. - Atrial septum: No defect or patent foramen ovale was identified.   PHYSICAL EXAM General: Well developed, obese, in no acute distress. Head: Normal Neck: Negative for carotid bruits. JVD not elevated. Lungs: Clear bilaterally to auscultation without wheezes, rales, or rhonchi. Breathing is unlabored. Heart: RRR S1 S2 without murmurs, rubs, or gallops.  Abdomen: Soft, non-tender, non-distended with normoactive bowel sounds. No hepatomegaly. No rebound/guarding. No obvious abdominal masses. Msk:  Strength and tone appears normal for age. Extremities: trace edema. No phlebitis.  Distal pedal pulses are 2+ and equal  bilaterally. Neuro: Alert and oriented X 3. Moves all extremities spontaneously. Psych:  Responds to questions appropriately with a normal affect.  ASSESSMENT AND PLAN: 1. Acute pulmonary embolus with left common femoral DVT. No right heart enlargement by Echo. Oxygenating well. Unfortunately she is not a candidate for Xarelto due to CKD. Currently on IV heparin. Coumadin started. Per pharmacy. INR already up to 2.31. ? Hyper-responsive. Hypercoaguable panel negative so far. Will DC ASA.   2.CAD s/p CABG.   3. CKD stage IV  4. DM- hypoglycemic yesterday noon. Resolved. Will reduce am insulin to 26 units.  5. Remote GI bleed.  Principal Problem:   Acute pulmonary embolism Active Problems:   Hypertension   Hypercholesterolemia   hx: breast cancer, right IDC ER + PR - Her 2 -, left paget's disease/DCIS receptor -    Diabetes mellitus   Coronary artery disease   H/O: GI bleed   CKD (chronic kidney disease), stage IV   DVT, lower extremity, proximal    Signed, Neala Miggins [place] MD,FACC 03/09/2013 8:55 AM

## 2013-03-10 LAB — HEPARIN LEVEL (UNFRACTIONATED): Heparin Unfractionated: 0.34 [IU]/mL (ref 0.30–0.70)

## 2013-03-10 LAB — PROTIME-INR
INR: 3.66 — ABNORMAL HIGH (ref 0.00–1.49)
Prothrombin Time: 35 seconds — ABNORMAL HIGH (ref 11.6–15.2)

## 2013-03-10 LAB — GLUCOSE, CAPILLARY
Glucose-Capillary: 89 mg/dL (ref 70–99)
Glucose-Capillary: 183 mg/dL — ABNORMAL HIGH (ref 70–99)

## 2013-03-10 LAB — CBC
HCT: 35 % — ABNORMAL LOW (ref 36.0–46.0)
Hemoglobin: 11.1 g/dL — ABNORMAL LOW (ref 12.0–15.0)
MCH: 29.7 pg (ref 26.0–34.0)
MCHC: 31.7 g/dL (ref 30.0–36.0)
MCV: 93.6 fL (ref 78.0–100.0)
Platelets: 321 K/uL (ref 150–400)
RBC: 3.74 MIL/uL — ABNORMAL LOW (ref 3.87–5.11)
RDW: 14.3 % (ref 11.5–15.5)
WBC: 6.8 K/uL (ref 4.0–10.5)

## 2013-03-10 NOTE — Progress Notes (Signed)
ANTICOAGULATION CONSULT NOTE - Follow Up Consult  Pharmacy Consult for Heparin/Coumadin Indication: pulmonary embolus and DVT  Allergies  Allergen Reactions  . Morphine    Patient Measurements: Height: 5\' 2"  (157.5 cm) Weight: 185 lb 14.4 oz (84.324 kg) IBW/kg (Calculated) : 50.1 Heparin Dosing Weight: 69 kg  Vital Signs: Temp: 98.7 F (37.1 C) (12/13 0508) Temp src: Oral (12/13 0508) BP: 130/76 mmHg (12/13 0508) Pulse Rate: 55 (12/13 0508)  Labs:  Recent Labs  03/07/13 1620 03/08/13 0605  03/09/13 0015 03/09/13 0610 03/10/13 0435  HGB 12.5 11.5*  --   --  11.3* 11.1*  HCT 38.7 35.7*  --   --  35.3* 35.0*  PLT 279 245  --   --  295 321  LABPROT  --  15.1  --   --  24.6* 35.0*  INR  --  1.22  --   --  2.31* 3.66*  HEPARINUNFRC  --  0.48  < > 0.44 0.40 0.34  CREATININE 1.78* 1.60*  --   --   --   --   < > = values in this interval not displayed. Estimated Creatinine Clearance: 25.9 ml/min (by C-G formula based on Cr of 1.6).  Medications:  . dextrose 10 mL/hr at 03/08/13 1400    Assessment: 79 year old female receiving Heparin bridging to Coumadin for new DVT/PE.  Her INR has continued to rise drastically after 3 doses of coumadin. INR today is supratherapeutic at 3.66. This could likely be due to interaction with Tamoxifen. There are multiple documented case reports of elevations in INR with combination tamoxifen and coumadin therapy but it is not unusual for patients to be on both drugs. CBC remains stable and patient reports no unusual signs of bleeding.   Goal of Therapy:  Heparin level 0.3-0.7 units/ml Monitor platelets by anticoagulation protocol: Yes   Plan: -Stop heparin drip today. -Hold coumadin dose today. -Daily heparin level, CBC, PT/INR -Monitor for s/s of bleeding.   Vinnie Level, PharmD.  Clinical Pharmacist Pager [phone]

## 2013-03-10 NOTE — Progress Notes (Signed)
Patient ID: Jacqueline Zuniga, female   DOB: 05/27/1925, 79 y.o.   MRN: [id number]   TELEMETRY: Reviewed telemetry pt in NSR: Filed Vitals:   03/09/13 0500 03/09/13 1441 03/09/13 2021 03/10/13 0508  BP: 130/44 122/55 142/71 130/76  Pulse: 61 61 66 55  Temp: 98.7 F (37.1 C) 98.6 F (37 C) 98.8 F (37.1 C) 98.7 F (37.1 C)  TempSrc: Oral Oral Oral Oral  Resp: 20 18 20 20   Height:      Weight: 185 lb 6.5 oz (84.1 kg)   185 lb 14.4 oz (84.324 kg)  SpO2: 91% 88% 92% 92%    Intake/Output Summary (Last 24 hours) at 03/10/13 1146 Last data filed at 03/10/13 0853  Gross per 24 hour  Intake    600 ml  Output      0 ml  Net    600 ml    SUBJECTIVE No complaints no dyspnea   LABS: Basic Metabolic Panel:  Recent Labs  08/65/78 1620 03/08/13 0605  NA 139 140  K 3.9 4.4  CL 102 105  CO2 23 21  GLUCOSE 121* 148*  BUN 46* 45*  CREATININE 1.78* 1.60*  CALCIUM 8.9 8.7   CBC:  Recent Labs  03/09/13 0610 03/10/13 0435  WBC 7.1 6.8  HGB 11.3* 11.1*  HCT 35.3* 35.0*  MCV 93.1 93.6  PLT 295 321    BNP: 271  D-Dimer: 1.28  Radiology/Studies:  Dg Chest 2 View  03/07/2013   CLINICAL DATA:  Shortness of breath  EXAM: CHEST  2 VIEW  COMPARISON:  03/05/2013  FINDINGS: Cardiac shadow is stable. Postsurgical changes are again seen. The lungs are clear bilaterally with the exception of mild scarring in the left lung base. Marland Kitchen  IMPRESSION: No active cardiopulmonary disease.   Electronically Signed   By: Alcide Clever M.D.   On: 03/07/2013 12:29   Dg Chest 2 View  03/05/2013   CLINICAL DATA:  Shortness of breath.  EXAM: CHEST  2 VIEW  COMPARISON:  05/20/2008  FINDINGS: Prior CABG.  Mild cardiomegaly.  Mild bibasilar scarring.  Mild thoracic spondylosis.  No edema.  IMPRESSION: 1. Mild cardiomegaly, without edema. 2. Stable bibasilar scarring.   Electronically Signed   By: Herbie Baltimore M.D.   On: 03/05/2013 14:45   Nm Pulmonary Perf And Vent  03/07/2013   EXAM: NUCLEAR MEDICINE  VENTILATION - PERFUSION LUNG SCAN  TECHNIQUE: Ventilation images were obtained in multiple projections using inhaled aerosol technetium 99 M DTPA. Perfusion images were obtained in multiple projections after intravenous injection of Tc-92m MAA.  COMPARISON:  PA lateral chest 03/07/2013.  RADIOPHARMACEUTICALS:  6 mCi Tc-88m DTPA aerosol and 40 mCi Tc-54m MAA  FINDINGS: Ventilation: No focal ventilation defect.  Perfusion: There is a medium to large perfusion defect in the anterior right upper lobe and in the post rib basal right lower lobe. Left lung perfusion is unremarkable.  IMPRESSION: Intermediate to high probability for pulmonary embolus.  Critical Value/emergent results were called by telephone at the time of interpretation on 03/07/2013 at 12:49 PM to Dr. Hillis Range, who verbally acknowledged these results.   Electronically Signed   By: Drusilla Kanner M.D.   On: 03/07/2013 13:22   LE venous doppler: DVT in left common femoral vein.  Echo:Study Conclusions  - Left ventricle: Inferobasal hypokinesis The cavity size was mildly dilated. Wall thickness was normal. Systolic function was normal. The estimated ejection fraction was in the range of 50% to 55%. - Mitral valve: Calcified  annulus. Mildly thickened leaflets . - Left atrium: The atrium was mildly dilated. - Right atrium: The atrium was mildly dilated. - Atrial septum: No defect or patent foramen ovale was identified.   PHYSICAL EXAM General: Well developed, obese, in no acute distress. Head: Normal Neck: Negative for carotid bruits. JVD not elevated. Lungs: Clear bilaterally to auscultation without wheezes, rales, or rhonchi. Breathing is unlabored. Heart: RRR S1 S2 without murmurs, rubs, or gallops.  Abdomen: Soft, non-tender, non-distended with normoactive bowel sounds. No hepatomegaly. No rebound/guarding. No obvious abdominal masses. Msk:  Strength and tone appears normal for age. Extremities: trace edema. No phlebitis.   Distal pedal pulses are 2+ and equal bilaterally. Neuro: Alert and oriented X 3. Moves all extremities spontaneously. Psych:  Responds to questions appropriately with a normal affect.  ASSESSMENT AND PLAN: 1. Acute pulmonary embolus with left common femoral DVT. No right heart enlargement by Echo. Oxygenating well. Unfortunately she is not a candidate for Xarelto due to CKD.  Very sensitive to coumadin ? From tamoxifen D/C heparin no coumadin today follow INR in am  Knowing what dose of coumadin to d/c on will be a challenge  2.CAD s/p CABG.   3. CKD stage IV  4. DM- hypoglycemic yesterday noon. Resolved. Will reduce am insulin to 26 units.  5. Remote GI bleed.    Vira Blanco MD,FACC 03/10/2013 11:46 AM

## 2013-03-10 NOTE — Progress Notes (Signed)
Dr. Johney Frame informed of INR and heparin being discontinued by pharmacy.. Also holding todays coumadin dose. Will also alert Dr. Eden Emms on his arrival to the floor.

## 2013-03-11 LAB — GLUCOSE, CAPILLARY
Glucose-Capillary: 89 mg/dL (ref 70–99)
Glucose-Capillary: 176 mg/dL — ABNORMAL HIGH (ref 70–99)
Glucose-Capillary: 126 mg/dL — ABNORMAL HIGH (ref 70–99)

## 2013-03-11 LAB — CBC
HCT: 35.8 % — ABNORMAL LOW (ref 36.0–46.0)
Hemoglobin: 11.4 g/dL — ABNORMAL LOW (ref 12.0–15.0)
MCH: 29.7 pg (ref 26.0–34.0)
MCV: 93.2 fL (ref 78.0–100.0)
RBC: 3.84 MIL/uL — ABNORMAL LOW (ref 3.87–5.11)
RDW: 14.2 % (ref 11.5–15.5)

## 2013-03-11 LAB — PROTIME-INR: Prothrombin Time: 28.1 seconds — ABNORMAL HIGH (ref 11.6–15.2)

## 2013-03-11 LAB — HEPARIN LEVEL (UNFRACTIONATED): Heparin Unfractionated: <0.1 IU/mL — ABNORMAL LOW (ref 0.30–0.70)

## 2013-03-11 MED ORDER — WARFARIN SODIUM 1 MG PO TABS
1.0000 mg | ORAL_TABLET | Freq: Once | ORAL | Status: AC
  Administered 2013-03-11: 1 mg via ORAL
  Filled 2013-03-11: qty 1

## 2013-03-11 NOTE — Progress Notes (Signed)
Patient ID: Jacqueline Zuniga, female   DOB: 10-Mar-1926, 79 y.o.   MRN: [id number]   TELEMETRY: Reviewed telemetry pt in NSR: Filed Vitals:   03/10/13 0508 03/10/13 1410 03/10/13 2100 03/11/13 0624  BP: 130/76 118/52 140/76 136/46  Pulse: 55 63 64 59  Temp: 98.7 F (37.1 C) 98 F (36.7 C) 98.5 F (36.9 C) 98.5 F (36.9 C)  TempSrc: Oral Oral Oral Oral  Resp: 20 18 18 18   Height:      Weight: 185 lb 14.4 oz (84.324 kg)   183 lb (83.008 kg)  SpO2: 92% 93% 93% 93%    Intake/Output Summary (Last 24 hours) at 03/11/13 0930 Last data filed at 03/11/13 0900  Gross per 24 hour  Intake    963 ml  Output      0 ml  Net    963 ml    SUBJECTIVE No complaints no dyspnea  Reading Sunday paper  CBC:  Recent Labs  03/10/13 0435 03/11/13 0400  WBC 6.8 7.1  HGB 11.1* 11.4*  HCT 35.0* 35.8*  MCV 93.6 93.2  PLT 321 324    BNP: 271  D-Dimer: 1.28  Radiology/Studies:  Dg Chest 2 View  03/07/2013   CLINICAL DATA:  Shortness of breath  EXAM: CHEST  2 VIEW  COMPARISON:  03/05/2013  FINDINGS: Cardiac shadow is stable. Postsurgical changes are again seen. The lungs are clear bilaterally with the exception of mild scarring in the left lung base. Marland Kitchen  IMPRESSION: No active cardiopulmonary disease.   Electronically Signed   By: Alcide Clever M.D.   On: 03/07/2013 12:29   Dg Chest 2 View  03/05/2013   CLINICAL DATA:  Shortness of breath.  EXAM: CHEST  2 VIEW  COMPARISON:  05/20/2008  FINDINGS: Prior CABG.  Mild cardiomegaly.  Mild bibasilar scarring.  Mild thoracic spondylosis.  No edema.  IMPRESSION: 1. Mild cardiomegaly, without edema. 2. Stable bibasilar scarring.   Electronically Signed   By: Herbie Baltimore M.D.   On: 03/05/2013 14:45   Nm Pulmonary Perf And Vent  03/07/2013   EXAM: NUCLEAR MEDICINE VENTILATION - PERFUSION LUNG SCAN  TECHNIQUE: Ventilation images were obtained in multiple projections using inhaled aerosol technetium 99 M DTPA. Perfusion images were obtained in multiple  projections after intravenous injection of Tc-68m MAA.  COMPARISON:  PA lateral chest 03/07/2013.  RADIOPHARMACEUTICALS:  6 mCi Tc-25m DTPA aerosol and 40 mCi Tc-37m MAA  FINDINGS: Ventilation: No focal ventilation defect.  Perfusion: There is a medium to large perfusion defect in the anterior right upper lobe and in the post rib basal right lower lobe. Left lung perfusion is unremarkable.  IMPRESSION: Intermediate to high probability for pulmonary embolus.  Critical Value/emergent results were called by telephone at the time of interpretation on 03/07/2013 at 12:49 PM to Dr. Hillis Range, who verbally acknowledged these results.   Electronically Signed   By: Drusilla Kanner M.D.   On: 03/07/2013 13:22   LE venous doppler: DVT in left common femoral vein.  Echo:Study Conclusions  - Left ventricle: Inferobasal hypokinesis The cavity size was mildly dilated. Wall thickness was normal. Systolic function was normal. The estimated ejection fraction was in the range of 50% to 55%. - Mitral valve: Calcified annulus. Mildly thickened leaflets . - Left atrium: The atrium was mildly dilated. - Right atrium: The atrium was mildly dilated. - Atrial septum: No defect or patent foramen ovale was identified.   PHYSICAL EXAM General: Well developed, obese, in no acute distress. Head:  Normal Neck: Negative for carotid bruits. JVD not elevated. Lungs: Clear bilaterally to auscultation without wheezes, rales, or rhonchi. Breathing is unlabored. Heart: RRR S1 S2 without murmurs, rubs, or gallops.  Abdomen: Soft, non-tender, non-distended with normoactive bowel sounds. No hepatomegaly. No rebound/guarding. No obvious abdominal masses. Msk:  Strength and tone appears normal for age. Extremities: trace edema. No phlebitis.  Distal pedal pulses are 2+ and equal bilaterally. Neuro: Alert and oriented X 3. Moves all extremities spontaneously. Psych:  Responds to questions appropriately with a normal  affect.  ASSESSMENT AND PLAN: 1. Acute pulmonary embolus with left common femoral DVT. No right heart enlargement by Echo. Oxygenating well. Unfortunately she is not a candidate for Xarelto due to CKD.  Very sensitive to coumadin ? From tamoxifen D/C heparin Pharmacy consider low dose coumadin today ? 4mg   D/C in am if INR in reasonable range   2.CAD s/p CABG.   3. CKD stage IV  4. DM- hypoglycemic yesterday noon. Resolved. Will reduce am insulin to 26 units.  5. Remote GI bleed.    Vira Blanco MD,FACC 03/11/2013 9:30 AM

## 2013-03-11 NOTE — Progress Notes (Signed)
ANTICOAGULATION CONSULT NOTE - Follow Up Consult  Pharmacy Consult for Coumadin Indication: pulmonary embolus and DVT  Allergies  Allergen Reactions  . Morphine    Patient Measurements: Height: 5\' 2"  (157.5 cm) Weight: 183 lb (83.008 kg) IBW/kg (Calculated) : 50.1 Heparin Dosing Weight: 69 kg  Vital Signs: Temp: 98.5 F (36.9 C) (12/14 0624) Temp src: Oral (12/14 0624) BP: 136/46 mmHg (12/14 0624) Pulse Rate: 59 (12/14 0624)  Labs:  Recent Labs  03/09/13 0610 03/10/13 0435 03/11/13 0400  HGB 11.3* 11.1* 11.4*  HCT 35.3* 35.0* 35.8*  PLT 295 321 324  LABPROT 24.6* 35.0* 28.1*  INR 2.31* 3.66* 2.74*  HEPARINUNFRC 0.40 0.34 <0.10*   Estimated Creatinine Clearance: 25.7 ml/min (by C-G formula based on Cr of 1.6).  Medications:  . dextrose 10 mL/hr at 03/08/13 1400    Assessment: 79 year old female receiving Heparin bridging to Coumadin for new DVT/PE.  Her INR has risen to 3.66 after three doses. This could likely be due to interaction with Tamoxifen. Her INR has trended down to 2.75 today (therapeutic). CBC remains stable and patient reports no unusual signs of bleeding. Plan for discharge in AM if INR is in reasonable range. Will repeat 1 mg dose today since she seems very sensitive to coumadin.   Goal of Therapy:  Heparin level 0.3-0.7 units/ml Monitor platelets by anticoagulation protocol: Yes   Plan: -Coumadin 1 mg x 1 dose tonight.  -Daily CBC, PT/INR -Monitor for s/s of bleeding.   Vinnie Level, PharmD.  Clinical Pharmacist Pager [phone]

## 2013-03-12 ENCOUNTER — Encounter (HOSPITAL_COMMUNITY): Payer: Self-pay

## 2013-03-12 LAB — PROTIME-INR: Prothrombin Time: 25.6 seconds — ABNORMAL HIGH (ref 11.6–15.2)

## 2013-03-12 LAB — GLUCOSE, CAPILLARY
Glucose-Capillary: 135 mg/dL — ABNORMAL HIGH (ref 70–99)
Glucose-Capillary: 176 mg/dL — ABNORMAL HIGH (ref 70–99)

## 2013-03-12 MED ORDER — WARFARIN SODIUM 1 MG PO TABS: 1.0000 mg | ORAL_TABLET | Freq: Every day | ORAL | Status: DC

## 2013-03-12 MED ORDER — INSULIN NPH (HUMAN) (ISOPHANE) 100 UNIT/ML ~~LOC~~ SUSP: 16.0000 [IU] | Freq: Two times a day (BID) | SUBCUTANEOUS | Status: DC

## 2013-03-12 NOTE — Discharge Summary (Signed)
Patient seen and examined and history reviewed. Agree with above findings and plan. See my earlier rounding note.  Jacqueline Zuniga 03/12/2013 8:39 AM

## 2013-03-12 NOTE — Progress Notes (Addendum)
Patient ID: Jacqueline Zuniga, female   DOB: Apr 17, 1925, 79 y.o.   MRN: [id number]   TELEMETRY: Reviewed telemetry pt in NSR with occ PVCs: Filed Vitals:   03/11/13 0624 03/11/13 1352 03/11/13 2100 03/12/13 0554  BP: 136/46 130/48 131/52 152/42  Pulse: 59 60 63 56  Temp: 98.5 F (36.9 C) 98.5 F (36.9 C) 98.6 F (37 C) 98.1 F (36.7 C)  TempSrc: Oral Oral Oral Oral  Resp: 18 16 18 18   Height:      Weight: 183 lb (83.008 kg)   183 lb 1.6 oz (83.054 kg)  SpO2: 93% 92% 93% 93%    Intake/Output Summary (Last 24 hours) at 03/12/13 0758 Last data filed at 03/11/13 1800  Gross per 24 hour  Intake    960 ml  Output      0 ml  Net    960 ml    SUBJECTIVE No complaints no dyspnea. Breathing is much better.  CBC:  Recent Labs  03/10/13 0435 03/11/13 0400  WBC 6.8 7.1  HGB 11.1* 11.4*  HCT 35.0* 35.8*  MCV 93.6 93.2  PLT 321 324    BNP: 271  D-Dimer: 1.28  Radiology/Studies:  Dg Chest 2 View  03/07/2013   CLINICAL DATA:  Shortness of breath  EXAM: CHEST  2 VIEW  COMPARISON:  03/05/2013  FINDINGS: Cardiac shadow is stable. Postsurgical changes are again seen. The lungs are clear bilaterally with the exception of mild scarring in the left lung base. Marland Kitchen  IMPRESSION: No active cardiopulmonary disease.   Electronically Signed   By: Alcide Clever M.D.   On: 03/07/2013 12:29   Dg Chest 2 View  03/05/2013   CLINICAL DATA:  Shortness of breath.  EXAM: CHEST  2 VIEW  COMPARISON:  05/20/2008  FINDINGS: Prior CABG.  Mild cardiomegaly.  Mild bibasilar scarring.  Mild thoracic spondylosis.  No edema.  IMPRESSION: 1. Mild cardiomegaly, without edema. 2. Stable bibasilar scarring.   Electronically Signed   By: Herbie Baltimore M.D.   On: 03/05/2013 14:45   Nm Pulmonary Perf And Vent  03/07/2013   EXAM: NUCLEAR MEDICINE VENTILATION - PERFUSION LUNG SCAN  TECHNIQUE: Ventilation images were obtained in multiple projections using inhaled aerosol technetium 99 M DTPA. Perfusion images were  obtained in multiple projections after intravenous injection of Tc-16m MAA.  COMPARISON:  PA lateral chest 03/07/2013.  RADIOPHARMACEUTICALS:  6 mCi Tc-65m DTPA aerosol and 40 mCi Tc-13m MAA  FINDINGS: Ventilation: No focal ventilation defect.  Perfusion: There is a medium to large perfusion defect in the anterior right upper lobe and in the post rib basal right lower lobe. Left lung perfusion is unremarkable.  IMPRESSION: Intermediate to high probability for pulmonary embolus.  Critical Value/emergent results were called by telephone at the time of interpretation on 03/07/2013 at 12:49 PM to Dr. Hillis Range, who verbally acknowledged these results.   Electronically Signed   By: Drusilla Kanner M.D.   On: 03/07/2013 13:22   LE venous doppler: DVT in left common femoral vein.  Echo:Study Conclusions  - Left ventricle: Inferobasal hypokinesis The cavity size was mildly dilated. Wall thickness was normal. Systolic function was normal. The estimated ejection fraction was in the range of 50% to 55%. - Mitral valve: Calcified annulus. Mildly thickened leaflets . - Left atrium: The atrium was mildly dilated. - Right atrium: The atrium was mildly dilated. - Atrial septum: No defect or patent foramen ovale was identified.   PHYSICAL EXAM General: Well developed, obese, in no  acute distress. Head: Normal Neck: Negative for carotid bruits. JVD not elevated. Lungs: Clear bilaterally to auscultation without wheezes, rales, or rhonchi. Breathing is unlabored. Heart: RRR S1 S2 without murmurs, rubs, or gallops.  Abdomen: Soft, non-tender, non-distended with normoactive bowel sounds. No hepatomegaly. No rebound/guarding. No obvious abdominal masses. Msk:  Strength and tone appears normal for age. Extremities: trace edema. No phlebitis.  Distal pedal pulses are 2+ and equal bilaterally. Neuro: Alert and oriented X 3. Moves all extremities spontaneously. Psych:  Responds to questions appropriately with a  normal affect.  ASSESSMENT AND PLAN: 1. Acute pulmonary embolus with left common femoral DVT. No right heart enlargement by Echo. Oxygenating well. Unfortunately she is not a candidate for Xarelto due to CKD.  Sensitive to coumadin? From tamoxifen. INRs have been therapeutic. OK for DC today with close follow up in [facility] coumadin clinic. Patient previously scheduled for outpatient myoview. Will cancel.    2.CAD s/p CABG.   3. CKD stage IV  4. DM- blood sugars OK. Insulin reduced on admission due to hypoglycemia.  5. Remote GI bleed.    Signed, Jontae Adebayo [place] MD,FACC 03/12/2013 7:58 AM

## 2013-03-12 NOTE — Discharge Summary (Signed)
Discharge Summary   Patient ID: Jacqueline Zuniga,  MRN: [id number], DOB/AGE: Aug 09, 1925 79 y.o.  Admit date: 03/07/2013 Discharge date: 03/12/2013  Primary Care Provider: Garlan Fillers Primary Cardiologist: P. [place], MD   Discharge Diagnoses Principal Problem:   Acute pulmonary embolism  **Coumadin initiated this admission.  **No right heart enlargement by echocardiogram this admission. Active Problems:   DVT, lower extremity, proximal  **Left common femoral vein.   hx: breast cancer, right IDC ER + PR - Her 2 -, left paget's disease/DCIS receptor -   **On tamoxifen.   Coronary artery disease   CKD (chronic kidney disease), stage IV   ANEMIA   Hypertension   Hypercholesterolemia   H/O: GI bleed   Diabetes mellitus   Chronic Systolic CHF  **EF 50-55% by echocardiogram this admission.  Allergies Allergies  Allergen Reactions  . Morphine    Procedures  Ventilation:Perfusion Scan 12.10.2014  IMPRESSION: Intermediate to high probability for pulmonary embolus. _____________   Bilateral Lower Extremity Venous Duplex 12.11.2014  Summary:  - Findings consistent with acute deep vein thrombosis   involving the common femoral vein of the left lower   extremity. - No evidence of Baker's cyst on the right or left. _____________   2D Echocardiogram 12.11.2014  Study Conclusions  - Left ventricle: Inferobasal hypokinesis The cavity size   was mildly dilated. Wall thickness was normal. Systolic   function was normal. The estimated ejection fraction was   in the range of 50% to 55%. - Mitral valve: Calcified annulus. Mildly thickened leaflets   . - Left atrium: The atrium was mildly dilated. - Right atrium: The atrium was mildly dilated. - Atrial septum: No defect or patent foramen ovale was   identified. _____________   History of Present Illness  79 y/o female with h/o CAD, chronic systolic CHF, breast cancer, diabetes, and remote upper GI bleeding, who was  recently seen in clinic on 03/05/2013 with complaints of dyspnea and chest pain.  Labs were drawn in the office and she was found to have an elevated d-dimer at 1.28.  She was then scheduled for a V:Q scan, which was performed on 12/10, and revealed an intermediate to high probability of acute pulmonary embolism, thus she was advised to present to [facility] for direct admission and anticoagulation.  Hospital Course  Following admission, patient was placed on coumadin with a heparin bridge.  A novel anticoagulant was considered but due to her chronic kidney disease, she was not felt to be a suitable candidate for NOAC therapy.  In the setting of oral anticoagulation and prior h/o upper GI bleeding, her home aspirin dose was discontinued.  Lower extremity dopplers were performed and showed an acute DVT involving the left common femoral vein.  INR rose quickly, presumed to be secondary to concomitant tamoxifen therapy, and peaked on 12/13 at 3.66.  At that point, heparin was d/c'd and coumadin was held for one day and subsequently resumed at 1mg  qpm on 12/14.  INR this AM is 2.43 and Jacqueline Zuniga will be discharged home today in good condition.  Given her coumadin sensitivity, we have asked her to have an INR drawn tomorrow morning.  She plans to have this followed through her primary care provider's office.  Of note, Jacqueline Zuniga experienced hypoglycemia during this hospitalization despite being on her home regimen of insulin.  Her dose was adjusted during admission and she will follow her blood glucoses closely at home and follow-up with her primary care provider to determine  appropriate dosing going forward.  Discharge Vitals Blood pressure 152/42, pulse 56, temperature 98.1 F (36.7 C), temperature source Oral, resp. rate 18, height 5\' 2"  (1.575 m), weight 183 lb 1.6 oz (83.054 kg), SpO2 93.00%.  Filed Weights   03/10/13 0508 03/11/13 0624 03/12/13 0554  Weight: 185 lb 14.4 oz (84.324 kg) 183 lb (83.008  kg) 183 lb 1.6 oz (83.054 kg)   Labs  CBC  Recent Labs  03/10/13 0435 03/11/13 0400  WBC 6.8 7.1  HGB 11.1* 11.4*  HCT 35.0* 35.8*  MCV 93.6 93.2  PLT 321 324   Basic Metabolic Panel Lab Results  Component Value Date   CREATININE 1.60* 03/08/2013   BUN 45* 03/08/2013   NA 140 03/08/2013   K 4.4 03/08/2013   CL 105 03/08/2013   CO2 21 03/08/2013    Lab Results  Component Value Date   INR 2.43* 03/12/2013   INR 2.74* 03/11/2013   INR 3.66* 03/10/2013   Disposition  Pt is being discharged home today in good condition.  Follow-up Plans & Appointments      Follow-up Information   Follow up with Garlan Fillers, MD On 03/13/2013. (For INR (coumadin level check).)    Specialty:  Internal Medicine   Contact information:   [address] [facility] [place] [address] [phone]       Follow up with Peter [place], MD On 04/30/2013. (4:15 PM)    Specialty:  Cardiology   Contact information:   [address] [place] [address] [phone]      Discharge Medications    Medication List    STOP taking these medications       aspirin 81 MG tablet      TAKE these medications       amLODipine 2.5 MG tablet  Commonly known as:  NORVASC  Take 1 tablet (2.5 mg total) by mouth daily.     carvedilol 12.5 MG tablet  Commonly known as:  COREG  Take 1 tablet (12.5 mg total) by mouth 2 (two) times daily with a meal.     fenofibrate 160 MG tablet  Take 1 tablet (160 mg total) by mouth daily.     Ferrous Sulfate Dried 140 (45 FE) MG Tbcr  Take 1 tablet by mouth daily.     furosemide 20 MG tablet  Commonly known as:  LASIX  Take 20 mg by mouth every morning.     gabapentin 300 MG capsule  Commonly known as:  NEURONTIN  Take 300 mg by mouth 4 (four) times daily.     hydrocodone-acetaminophen 5-500 MG per capsule  Commonly known as:  LORCET-HD  Take 1 capsule by mouth every 6 (six) hours as needed for pain.       insulin aspart 100 UNIT/ML injection  Commonly known as:  novoLOG  Inject 4-10 Units into the skin 3 (three) times daily before meals. Take 4 units before breakfast and 10 units before dinner     insulin NPH 100 UNIT/ML injection  Commonly known as:  HUMULIN N,NOVOLIN N  Inject 16-26 Units into the skin 2 (two) times daily at 8 am and 10 pm. Take 26 units with breakfast and 16 units with dinner     multivitamin per tablet  Take 1 tablet by mouth daily.     nitroGLYCERIN 0.4 MG SL tablet  Commonly known as:  NITROSTAT  Place 1 tablet (0.4 mg total) under the tongue every 5 (five) minutes as  needed.     omeprazole 20 MG capsule  Commonly known as:  PRILOSEC  Take 1 capsule (20 mg total) by mouth daily.     pravastatin 40 MG tablet  Commonly known as:  PRAVACHOL  Take 1 tablet (40 mg total) by mouth daily.     tamoxifen 20 MG tablet  Commonly known as:  NOLVADEX  Take 20 mg by mouth daily.     Vitamin D3 400 UNITS tablet  Take 400 Units by mouth daily.     warfarin 1 MG tablet  Commonly known as:  COUMADIN  Take 1 tablet (1 mg total) by mouth daily at 6 PM.       Outstanding Labs/Studies  INR on 12/16.  Duration of Discharge Encounter   Greater than 30 minutes including physician time.  Signed, Nicolasa Ducking NP 03/12/2013, 8:37 AM

## 2013-03-26 ENCOUNTER — Encounter (HOSPITAL_COMMUNITY): Payer: Medicare Other

## 2013-04-30 ENCOUNTER — Ambulatory Visit (INDEPENDENT_AMBULATORY_CARE_PROVIDER_SITE_OTHER): Payer: Medicare Other | Admitting: Cardiology

## 2013-04-30 ENCOUNTER — Encounter: Payer: Self-pay | Admitting: Cardiology

## 2013-04-30 VITALS — BP 126/62 | HR 64 | Ht 61.0 in | Wt 181.0 lb

## 2013-04-30 DIAGNOSIS — I2699 Other pulmonary embolism without acute cor pulmonale: Secondary | ICD-10-CM

## 2013-04-30 DIAGNOSIS — I5022 Chronic systolic (congestive) heart failure: Secondary | ICD-10-CM

## 2013-04-30 DIAGNOSIS — I10 Essential (primary) hypertension: Secondary | ICD-10-CM

## 2013-04-30 DIAGNOSIS — I2510 Atherosclerotic heart disease of native coronary artery without angina pectoris: Secondary | ICD-10-CM

## 2013-04-30 DIAGNOSIS — E7800 Pure hypercholesterolemia, unspecified: Secondary | ICD-10-CM

## 2013-04-30 DIAGNOSIS — I509 Heart failure, unspecified: Secondary | ICD-10-CM

## 2013-04-30 NOTE — Progress Notes (Signed)
Jacqueline Zuniga Date of Birth: 12/07/1925   History of Present Illness: Jacqueline Zuniga is seen for followup today. She has a history of coronary disease and is status post stenting of the LAD in 2005. Her last stress test in February 2010 was normal. She also a history of diabetes mellitus, hypertension, and hyperlipidemia. She was admitted in December with an acute PE and DVT. Because of CKD she was not a candidate for one of the newer anticoagulants and was placed on coumadin. On follow up she reports she is doing well. She only gets SOB with heavy housework. No edema or chest pain. No bleeding on coumadin.  Current Outpatient Prescriptions on File Prior to Visit  Medication Sig Dispense Refill  . amLODipine (NORVASC) 2.5 MG tablet Take 1 tablet (2.5 mg total) by mouth daily.  180 tablet  3  . carvedilol (COREG) 12.5 MG tablet Take 1 tablet (12.5 mg total) by mouth 2 (two) times daily with a meal.  180 tablet  3  . Cholecalciferol (VITAMIN D3) 400 UNITS tablet Take 400 Units by mouth daily.        . fenofibrate 160 MG tablet Take 1 tablet (160 mg total) by mouth daily.  90 tablet  3  . Ferrous Sulfate Dried 140 (45 FE) MG TBCR Take 1 tablet by mouth daily.        . furosemide (LASIX) 20 MG tablet Take 20 mg by mouth every morning.      . gabapentin (NEURONTIN) 300 MG capsule Take 300 mg by mouth 4 (four) times daily.      . hydrocodone-acetaminophen (LORCET-HD) 5-500 MG per capsule Take 1 capsule by mouth every 6 (six) hours as needed for pain.       Marland Kitchen insulin aspart (NOVOLOG) 100 UNIT/ML injection Inject 4-10 Units into the skin 3 (three) times daily before meals. Take 4 units before breakfast and 10 units before dinner      . insulin NPH (HUMULIN N,NOVOLIN N) 100 UNIT/ML injection Inject 16-26 Units into the skin 2 (two) times daily at 8 am and 10 pm. Take 26 units with breakfast and 16 units with dinner  1 vial  12  . multivitamin (THERAGRAN) per tablet Take 1 tablet by mouth daily.        .  nitroGLYCERIN (NITROSTAT) 0.4 MG SL tablet Place 1 tablet (0.4 mg total) under the tongue every 5 (five) minutes as needed.  90 tablet  3  . omeprazole (PRILOSEC) 20 MG capsule Take 1 capsule (20 mg total) by mouth daily.  90 capsule  3  . pravastatin (PRAVACHOL) 40 MG tablet Take 1 tablet (40 mg total) by mouth daily.  90 tablet  3  . tamoxifen (NOLVADEX) 20 MG tablet Take 20 mg by mouth daily.      Marland Kitchen warfarin (COUMADIN) 1 MG tablet Take 1 tablet (1 mg total) by mouth daily at 6 PM.  30 tablet  1   No current facility-administered medications on file prior to visit.    Allergies  Allergen Reactions  . Morphine     Past Medical History  Diagnosis Date  . Hypertension   . Hypercholesterolemia   . H/O: GI bleed     a. Anemia 2004: EGD showing a partially healed duodenal ulcer. b. Ulcerative reflux esophagitis and schatzki ring by EGD 2010.  Marland Kitchen Coronary artery disease     a. s/p LAD stent 03/2003. b. Recurrent CP and abnormal nuc s/p CABGx2 (LIMA-LAD, SVG-Diag) in 10/2003. c.  Last nuc 2010 reportedly normal per Dr. Elvis CoilJordan's note.  . Chronic systolic CHF (congestive heart failure)     a. EF 35-40% by echo 2005;  b. 02/2013 Echo: EF 50-55%, mildly dil LA/RA.  . hx: breast cancer, right IDC ER + PR - Her 2 -, left paget's disease/DCIS receptor -  08/12/2011    Patient diagnosed with right breast invasive ductal carcinoma 04/25/2008. Patient underwent right partial mastectomy on 05/21/2008. Pathology showed invasive ductal carcinoma, margins negative. ER positive at 99%. PR negative. Her 2 negative. Ki 67 at 25%. Patient then diagnosed with left nipple paget's disease on 04/01/2010. Patient underwent left lumpectomy on 04/30/2010. Pathology showed paget's dise  . CKD (chronic kidney disease)   . DVT of deep femoral vein     a. 02/2013 acute DVT of left common femoral vein-->coumadin initiated.  . Pulmonary embolism     a. 02/2013 intermediate to high risk V:Q scan-->coumadin initiated.  . Type II  diabetes mellitus   . Chronic anemia   . History of blood transfusion     "several"   . GERD (gastroesophageal reflux disease)   . Arthritis     "left shoulder; hips" (03/07/2013)  . Chronic lower back pain   . Lumbar stenosis   . Uterine cancer 1966  . Breast cancer     "both breasts"   . Skin cancer of face     "forehead and both temples"   . Schatzki's ring   . Anticoagulated on Coumadin     a. 02/2013 LCFV DVT/PE.    Past Surgical History  Procedure Laterality Date  . Shoulder arthroscopy w/ rotator cuff repair Left   . Breast lumpectomy Right 2010  . Mastectomy partial / lumpectomy Left 2012  . Coronary artery bypass graft  2005    CABG X2  . Coronary angioplasty with stent placement  04/01/2003    1. Left heart catheterization. 2. Coronary angiography  3. Left ventricular angiography. 4 . Stenting of the mid left anterior descending.  . Abdominal hysterectomy  1966  . Appendectomy  1966  . Cataract extraction w/ intraocular lens  implant, bilateral Bilateral ~ 2007  . Dilation and curettage of uterus      History  Smoking status  . Former Smoker -- 0.50 packs/day for 4 years  . Types: Cigarettes  . Quit date: 03/29/1964  Smokeless tobacco  . Never Used    History  Alcohol Use No    Family History  Problem Relation Age of Onset  . Heart failure Mother   . Heart disease Mother   . Heart attack Brother   . Coronary artery disease Father   . Heart failure Sister   . Stroke Brother     had bypass surgery    Review of Systems: As noted in history of present illness. All other systems were reviewed and are negative.  Physical Exam: BP 126/62  Pulse 64  Ht 5\' 1"  (1.549 m)  Wt 181 lb (82.101 kg)  BMI 34.22 kg/m2 She is an overweight white female in no acute distress. HEENT exam is unremarkable.  Sclera are clear. PERRL, oropharynx is clear.  She has no JVD or bruits. Lungs are clear. Cardiac exam reveals a regular rate and rhythm without gallop, murmur,  or click. Abdomen is soft and nontender. She has no edema. Pedal pulses are good. She has trace edema. She is alert oriented x3. Cranial nerves II through XII are intact.  LABORATORY:   Assessment / Plan: 1.  Acute PE/DVT in December. Now on coumadin. No evidence of right heart failure.  2. Coronary disease status post remote stenting of the LAD.  Continue  carvedilol. Risk factor modification.  Last stress test 5 years ago. Since she is asymptomatic will follow clinically. No ASA since she is on coumadin.  2. Hypertension, improved controlled on amlodipine. She has a history of cough on ACE inhibitors. Renal insufficiency on Diovan HCT.   3. Diabetes mellitus type 2.  4. Hypercholesterolemia.

## 2013-04-30 NOTE — Patient Instructions (Signed)
Continue your current therapy  I will see you in 6 months.   

## 2013-05-11 ENCOUNTER — Other Ambulatory Visit: Payer: Self-pay | Admitting: Cardiology

## 2013-05-11 ENCOUNTER — Other Ambulatory Visit (HOSPITAL_COMMUNITY): Payer: Self-pay | Admitting: Nurse Practitioner

## 2013-05-14 NOTE — Telephone Encounter (Signed)
Jacqueline Filler do you know who manages her coumadin

## 2013-06-06 ENCOUNTER — Other Ambulatory Visit: Payer: Self-pay | Admitting: Cardiology

## 2013-06-13 ENCOUNTER — Other Ambulatory Visit: Payer: Self-pay | Admitting: Cardiology

## 2013-06-22 ENCOUNTER — Other Ambulatory Visit: Payer: Self-pay | Admitting: Cardiology

## 2013-06-27 ENCOUNTER — Telehealth: Payer: Self-pay | Admitting: Cardiology

## 2013-06-27 NOTE — Telephone Encounter (Signed)
New message    [facility] [facility] calling asking for ejection fraction .    When was the last echo done.

## 2013-06-27 NOTE — Telephone Encounter (Signed)
Returned call to [employer] RN with [facility] left message on confidential voice mail patient's last echo 03/08/14 with EF 50 to 55 %.

## 2013-07-02 ENCOUNTER — Other Ambulatory Visit: Payer: Self-pay | Admitting: Cardiology

## 2013-09-03 ENCOUNTER — Ambulatory Visit: Payer: Medicare Other | Admitting: Cardiology

## 2013-09-04 ENCOUNTER — Encounter: Payer: Self-pay | Admitting: Cardiology

## 2013-09-05 ENCOUNTER — Encounter: Payer: Self-pay | Admitting: Cardiology

## 2013-09-14 ENCOUNTER — Other Ambulatory Visit: Payer: Self-pay | Admitting: Cardiology

## 2013-11-14 ENCOUNTER — Ambulatory Visit: Payer: Medicare Other | Admitting: Cardiology

## 2013-11-19 ENCOUNTER — Other Ambulatory Visit: Payer: Self-pay | Admitting: Cardiology

## 2013-12-05 ENCOUNTER — Encounter: Payer: Self-pay | Admitting: Internal Medicine

## 2013-12-19 ENCOUNTER — Other Ambulatory Visit: Payer: Self-pay | Admitting: Cardiology

## 2013-12-26 ENCOUNTER — Other Ambulatory Visit: Payer: Self-pay | Admitting: Cardiology

## 2014-01-15 ENCOUNTER — Other Ambulatory Visit: Payer: Self-pay | Admitting: Cardiology

## 2014-01-24 ENCOUNTER — Ambulatory Visit (INDEPENDENT_AMBULATORY_CARE_PROVIDER_SITE_OTHER): Payer: Medicare Other | Admitting: Cardiology

## 2014-01-24 ENCOUNTER — Encounter: Payer: Self-pay | Admitting: Cardiology

## 2014-01-24 VITALS — BP 160/72 | HR 61 | Ht 61.0 in | Wt 177.0 lb

## 2014-01-24 DIAGNOSIS — I10 Essential (primary) hypertension: Secondary | ICD-10-CM

## 2014-01-24 DIAGNOSIS — Z86711 Personal history of pulmonary embolism: Secondary | ICD-10-CM

## 2014-01-24 DIAGNOSIS — N184 Chronic kidney disease, stage 4 (severe): Secondary | ICD-10-CM

## 2014-01-24 DIAGNOSIS — I2510 Atherosclerotic heart disease of native coronary artery without angina pectoris: Secondary | ICD-10-CM

## 2014-01-24 NOTE — Patient Instructions (Signed)
We will schedule you for an Echocardiogram  Continue your other therapy

## 2014-01-24 NOTE — Progress Notes (Signed)
Jacqueline Zuniga Date of Birth: 1925/09/17   History of Present Illness: Jacqueline Zuniga is seen for followup today. She has a history of coronary disease and is status post stenting of the LAD in 2005. Her last stress test in February 2010 was normal. She also a history of diabetes mellitus, hypertension, and hyperlipidemia. She was admitted in December with an acute PE and DVT. Because of CKD she was not a candidate for one of the newer anticoagulants and was placed on coumadin. On follow up she reports that she can't do much without getting SOB. Can't do her housework anymore. No edema or cough. No chest pain. Recent INR 8.0.  Current Outpatient Prescriptions on File Prior to Visit  Medication Sig Dispense Refill  . amLODipine (NORVASC) 2.5 MG tablet TAKE 1 TABLET ONCE A DAY  30 tablet  1  . carvedilol (COREG) 12.5 MG tablet TAKE 1 TABLET TWICE DAILY WITH BREAKFAST & SUPPER  60 tablet  0  . Cholecalciferol (VITAMIN D3) 400 UNITS tablet Take 400 Units by mouth daily.        . fenofibrate 160 MG tablet TAKE 1 TABLET ONCE A DAY  90 tablet  1  . Ferrous Sulfate Dried 140 (45 FE) MG TBCR Take 1 tablet by mouth daily.        . furosemide (LASIX) 20 MG tablet TAKE 1 TABLET DAILY AS DIRECTED  30 tablet  10  . gabapentin (NEURONTIN) 300 MG capsule Take 300 mg by mouth 4 (four) times daily.      . hydrocodone-acetaminophen (LORCET-HD) 5-500 MG per capsule Take 1 capsule by mouth every 6 (six) hours as needed for pain.       Marland Kitchen insulin aspart (NOVOLOG) 100 UNIT/ML injection Inject 4-10 Units into the skin 3 (three) times daily before meals. Take 4 units before breakfast and 10 units before dinner      . insulin NPH (HUMULIN N,NOVOLIN N) 100 UNIT/ML injection Inject 16-26 Units into the skin 2 (two) times daily at 8 am and 10 pm. Take 26 units with breakfast and 16 units with dinner  1 vial  12  . multivitamin (THERAGRAN) per tablet Take 1 tablet by mouth daily.        . nitroGLYCERIN (NITROSTAT) 0.4 MG SL  tablet Place 1 tablet (0.4 mg total) under the tongue every 5 (five) minutes as needed.  90 tablet  3  . NOVOLIN R 100 UNIT/ML injection 4-10 Units daily.      Marland Kitchen omeprazole (PRILOSEC) 20 MG capsule TAKE (1) CAPSULE DAILY  90 capsule  1  . pravastatin (PRAVACHOL) 40 MG tablet Take 1 tablet (40 mg total) by mouth daily.  90 tablet  3  . tamoxifen (NOLVADEX) 20 MG tablet Take 20 mg by mouth daily.      Marland Kitchen warfarin (COUMADIN) 1 MG tablet Take 1 tablet (1 mg total) by mouth daily at 6 PM.  30 tablet  1   No current facility-administered medications on file prior to visit.    Allergies  Allergen Reactions  . Morphine   . Morphine And Related Nausea And Vomiting    Past Medical History  Diagnosis Date  . Hypertension   . Hypercholesterolemia   . H/O: GI bleed     a. Anemia 2004: EGD showing a partially healed duodenal ulcer. b. Ulcerative reflux esophagitis and schatzki ring by EGD 2010.  Marland Kitchen Coronary artery disease     a. s/p LAD stent 03/2003. b. Recurrent CP and abnormal  nuc s/p CABGx2 (LIMA-LAD, SVG-Diag) in 10/2003. c. Last nuc 2010 reportedly normal per [name] note.  . Chronic systolic CHF (congestive heart failure)     a. EF 35-40% by echo 2005;  b. 02/2013 Echo: EF 50-55%, mildly dil LA/RA.  . hx: breast cancer, right IDC ER + PR - Her 2 -, left paget's disease/DCIS receptor -  08/12/2011    Patient diagnosed with right breast invasive ductal carcinoma 04/25/2008. Patient underwent right partial mastectomy on 05/21/2008. Pathology showed invasive ductal carcinoma, margins negative. ER positive at 99%. PR negative. Her 2 negative. Ki 67 at 25%. Patient then diagnosed with left nipple paget's disease on 04/01/2010. Patient underwent left lumpectomy on 04/30/2010. Pathology showed paget's dise  . CKD (chronic kidney disease)   . DVT of deep femoral vein     a. 02/2013 acute DVT of left common femoral vein-->coumadin initiated.  . Pulmonary embolism     a. 02/2013 intermediate to high risk V:Q  scan-->coumadin initiated.  . Type II diabetes mellitus   . Chronic anemia   . History of blood transfusion     "several"   . GERD (gastroesophageal reflux disease)   . Arthritis     "left shoulder; hips" (03/07/2013)  . Chronic lower back pain   . Lumbar stenosis   . Uterine cancer 1966  . Breast cancer     "both breasts"   . Skin cancer of face     "forehead and both temples"   . Schatzki's ring   . Anticoagulated on Coumadin     a. 02/2013 LCFV DVT/PE.    Past Surgical History  Procedure Laterality Date  . Shoulder arthroscopy w/ rotator cuff repair Left   . Breast lumpectomy Right 2010  . Mastectomy partial / lumpectomy Left 2012  . Coronary artery bypass graft  2005    CABG X2  . Coronary angioplasty with stent placement  04/01/2003    1. Left heart catheterization. 2. Coronary angiography  3. Left ventricular angiography. 4 . Stenting of the mid left anterior descending.  . Abdominal hysterectomy  1966  . Appendectomy  1966  . Cataract extraction w/ intraocular lens  implant, bilateral Bilateral ~ 2007  . Dilation and curettage of uterus      History  Smoking status  . Former Smoker -- 0.50 packs/day for 4 years  . Types: Cigarettes  . Quit date: 03/29/1964  Smokeless tobacco  . Never Used    History  Alcohol Use No    Family History  Problem Relation Age of Onset  . Heart failure Mother   . Heart disease Mother   . Heart attack Brother   . Coronary artery disease Father   . Heart failure Sister   . Stroke Brother     had bypass surgery    Review of Systems: As noted in history of present illness. All other systems were reviewed and are negative.  Physical Exam: BP 160/72  Pulse 61  Ht 5\' 1"  (1.549 m)  Wt 177 lb (80.287 kg)  BMI 33.46 kg/m2 Oxygen sat 91%. She is an overweight white female in no acute distress. HEENT exam is unremarkable.  Sclera are clear. PERRL, oropharynx is clear.  She has no JVD or bruits. Lungs are clear. Cardiac exam  reveals a regular rate and rhythm without gallop, murmur, or click. Abdomen is soft and nontender. She has no edema. Pedal pulses are good. She has trace edema. She is alert oriented x3. Cranial nerves II through  XII are intact.  LABORATORY: Ecg: NSR with LAD, NSIVCD, old septal infarct. I have personally reviewed and interpreted this study.   Assessment / Plan: 1. Acute PE/DVT in December 2014. Now on coumadin. No evidence of right heart failure by exam but persistently SOB. Will check Echo to see if she has pulmonary HTN.  2. Coronary disease status post remote stenting of the LAD.  Continue  carvedilol. Risk factor modification.  Last stress test 5 years ago. Since she is asymptomatic will follow clinically. No ASA since she is on coumadin.  2. Hypertension, well controlled on amlodipine. She has a history of cough on ACE inhibitors. Renal insufficiency on Diovan HCT.   3. Diabetes mellitus type 2.  4. Hypercholesterolemia.

## 2014-01-29 ENCOUNTER — Ambulatory Visit (HOSPITAL_COMMUNITY)
Admission: RE | Admit: 2014-01-29 | Discharge: 2014-01-29 | Disposition: A | Discharge status: Home / Self Care | Payer: Medicare Other | Source: Ambulatory Visit | Attending: Cardiovascular Disease | Admitting: Cardiovascular Disease

## 2014-01-29 ENCOUNTER — Other Ambulatory Visit: Payer: Self-pay | Admitting: Cardiology

## 2014-01-29 DIAGNOSIS — E119 Type 2 diabetes mellitus without complications: Secondary | ICD-10-CM | POA: Insufficient documentation

## 2014-01-29 DIAGNOSIS — N184 Chronic kidney disease, stage 4 (severe): Secondary | ICD-10-CM

## 2014-01-29 DIAGNOSIS — Z87891 Personal history of nicotine dependence: Secondary | ICD-10-CM | POA: Diagnosis not present

## 2014-01-29 DIAGNOSIS — I059 Rheumatic mitral valve disease, unspecified: Secondary | ICD-10-CM

## 2014-01-29 DIAGNOSIS — Z8249 Family history of ischemic heart disease and other diseases of the circulatory system: Secondary | ICD-10-CM | POA: Insufficient documentation

## 2014-01-29 DIAGNOSIS — I2510 Atherosclerotic heart disease of native coronary artery without angina pectoris: Secondary | ICD-10-CM | POA: Diagnosis not present

## 2014-01-29 DIAGNOSIS — I10 Essential (primary) hypertension: Secondary | ICD-10-CM

## 2014-01-29 DIAGNOSIS — E785 Hyperlipidemia, unspecified: Secondary | ICD-10-CM | POA: Diagnosis not present

## 2014-01-29 DIAGNOSIS — Z86711 Personal history of pulmonary embolism: Secondary | ICD-10-CM

## 2014-01-29 DIAGNOSIS — I2699 Other pulmonary embolism without acute cor pulmonale: Secondary | ICD-10-CM

## 2014-01-29 NOTE — Progress Notes (Signed)
2D Echocardiogram Complete.  01/29/2014   Marsh Heckler [place], [place]

## 2014-02-04 ENCOUNTER — Other Ambulatory Visit: Payer: Self-pay

## 2014-02-04 DIAGNOSIS — I519 Heart disease, unspecified: Secondary | ICD-10-CM

## 2014-02-04 DIAGNOSIS — I5022 Chronic systolic (congestive) heart failure: Secondary | ICD-10-CM

## 2014-02-04 MED ORDER — FUROSEMIDE 40 MG PO TABS: 40.0000 mg | ORAL_TABLET | Freq: Every day | ORAL | Status: DC

## 2014-02-20 ENCOUNTER — Other Ambulatory Visit: Payer: Self-pay | Admitting: Cardiology

## 2014-02-20 NOTE — Telephone Encounter (Signed)
Rx has been sent to the pharmacy electronically. ° °

## 2014-02-28 ENCOUNTER — Telehealth: Payer: Self-pay

## 2014-02-28 DIAGNOSIS — R7981 Abnormal blood-gas level: Secondary | ICD-10-CM

## 2014-02-28 NOTE — Telephone Encounter (Signed)
Patient called Dr.Jordan reviewed overnight oximetry which revealed low oxygen.Advised needs O2 2L/min at night.Advised needs a pulmonary referral.Schedulers will call back with pulmonary appointment.Mandy at [facility] will call to set up home O2.Advised to call back if needed.

## 2014-03-19 ENCOUNTER — Institutional Professional Consult (permissible substitution): Payer: Medicare Other | Admitting: Pulmonary Disease

## 2014-03-21 ENCOUNTER — Encounter: Payer: Self-pay | Admitting: Cardiology

## 2014-04-10 ENCOUNTER — Other Ambulatory Visit: Payer: Self-pay | Admitting: Nurse Practitioner

## 2014-04-24 ENCOUNTER — Institutional Professional Consult (permissible substitution): Payer: Medicare Other | Admitting: Pulmonary Disease

## 2014-05-01 ENCOUNTER — Institutional Professional Consult (permissible substitution): Payer: Self-pay | Admitting: Internal Medicine

## 2014-05-14 ENCOUNTER — Institutional Professional Consult (permissible substitution): Payer: Self-pay | Admitting: Internal Medicine

## 2014-05-24 ENCOUNTER — Ambulatory Visit (INDEPENDENT_AMBULATORY_CARE_PROVIDER_SITE_OTHER)
Admission: RE | Admit: 2014-05-24 | Discharge: 2014-05-24 | Disposition: A | Discharge status: Home / Self Care | Payer: Medicare Other | Source: Ambulatory Visit | Attending: Internal Medicine | Admitting: Internal Medicine

## 2014-05-24 ENCOUNTER — Ambulatory Visit (INDEPENDENT_AMBULATORY_CARE_PROVIDER_SITE_OTHER): Payer: Medicare Other | Admitting: Internal Medicine

## 2014-05-24 ENCOUNTER — Encounter: Payer: Self-pay | Admitting: Internal Medicine

## 2014-05-24 ENCOUNTER — Encounter (INDEPENDENT_AMBULATORY_CARE_PROVIDER_SITE_OTHER): Payer: Medicare Other

## 2014-05-24 VITALS — BP 114/60 | HR 60 | Ht 62.0 in | Wt 170.0 lb

## 2014-05-24 DIAGNOSIS — I824Y9 Acute embolism and thrombosis of unspecified deep veins of unspecified proximal lower extremity: Secondary | ICD-10-CM

## 2014-05-24 DIAGNOSIS — R0600 Dyspnea, unspecified: Secondary | ICD-10-CM

## 2014-05-24 DIAGNOSIS — Z86711 Personal history of pulmonary embolism: Secondary | ICD-10-CM

## 2014-05-24 DIAGNOSIS — N184 Chronic kidney disease, stage 4 (severe): Secondary | ICD-10-CM

## 2014-05-24 LAB — CBC WITH DIFFERENTIAL/PLATELET
BASOS PCT: 0.4 % (ref 0.0–3.0)
Basophils Absolute: 0 10*3/uL (ref 0.0–0.1)
Eosinophils Absolute: 0.2 10*3/uL (ref 0.0–0.7)
Eosinophils Relative: 2 % (ref 0.0–5.0)
HCT: 41.3 % (ref 36.0–46.0)
HEMOGLOBIN: 13.7 g/dL (ref 12.0–15.0)
Lymphocytes Relative: 26.9 % (ref 12.0–46.0)
Lymphs Abs: 2.1 10*3/uL (ref 0.7–4.0)
MCHC: 33.2 g/dL (ref 30.0–36.0)
MCV: 87.6 fl (ref 78.0–100.0)
Monocytes Absolute: 0.6 10*3/uL (ref 0.1–1.0)
Monocytes Relative: 7.5 % (ref 3.0–12.0)
NEUTROS ABS: 4.9 10*3/uL (ref 1.4–7.7)
Neutrophils Relative %: 63.2 % (ref 43.0–77.0)
Platelets: 274 10*3/uL (ref 150.0–400.0)
RBC: 4.71 Mil/uL (ref 3.87–5.11)
RDW: 16.3 % — ABNORMAL HIGH (ref 11.5–15.5)
WBC: 7.7 10*3/uL (ref 4.0–10.5)

## 2014-05-24 LAB — BRAIN NATRIURETIC PEPTIDE: Pro B Natriuretic peptide (BNP): 311 pg/mL — ABNORMAL HIGH (ref 0.0–100.0)

## 2014-05-24 LAB — BASIC METABOLIC PANEL
BUN: 45 mg/dL — AB (ref 6–23)
CALCIUM: 9.9 mg/dL (ref 8.4–10.5)
CO2: 31 mEq/L (ref 19–32)
Chloride: 105 mEq/L (ref 96–112)
Creatinine, Ser: 1.69 mg/dL — ABNORMAL HIGH (ref 0.40–1.20)
GFR: 30.42 mL/min — AB (ref >60.00)
Glucose, Bld: 135 mg/dL — ABNORMAL HIGH (ref 70–99)
POTASSIUM: 4.1 meq/L (ref 3.5–5.1)
Sodium: 143 mEq/L (ref 135–145)

## 2014-05-24 LAB — TSH: TSH: 1.87 u[IU]/mL (ref 0.35–4.50)

## 2014-05-24 MED ORDER — OMEPRAZOLE 20 MG PO CPDR: DELAYED_RELEASE_CAPSULE | ORAL | Status: DC

## 2014-05-24 NOTE — Assessment & Plan Note (Signed)
-   05/24/2014   Walked RA x one lap @ 185 stopped due to min sob, nl pace, no desats  Symptoms are markedly disproportionate to objective findings and not clear this is a lung problem but pt does appear to have difficult airway management issues. DDX of  difficult airways management all start with A and  include Adherence, Ace Inhibitors, Acid Reflux, Active Sinus Disease, Alpha 1 Antitripsin deficiency, Anxiety masquerading as Airways dz,  ABPA,  allergy(esp in young), Aspiration (esp in elderly), Adverse effects of DPI,  Active smokers, A bund of PE  plus two Bs  = Bronchiectasis and Beta blocker use..and one C= CHF  Adherence is always the initial "prime suspect" and is a multilayered concern that requires a "trust but verify" approach in every patient - starting with knowing how to use medications, especially inhalers, correctly, keeping up with refills and understanding the fundamental difference between maintenance and prns vs those medications only taken for a very short course and then stopped and not refilled.  - very difficult to be sure she takes meds the was she says due to communication gap  ? Acid (or non-acid) GERD > always difficult to exclude as up to 75% of pts in some series report no assoc GI/ Heartburn symptoms> rec continue max (24h)  acid suppression and diet restrictions/ reviewed     ? chf > last echo 01/29/14 reviewed, bnp indeterminant > may need cpst to sort out   ? A bunch of pe > seems unlikley since  Coumadin made little difference in her symptoms, V/Q repeat next step   ? Anxiety /depression / deconditioning > dx remains one of exclusion but suspect a component.

## 2014-05-24 NOTE — Patient Instructions (Addendum)
Take prilosec  30- 60 min before your first and last meals of the day   GERD (REFLUX)  is an extremely common cause of respiratory symptoms just like yours , many times with no obvious heartburn at all.    It can be treated with medication, but also with lifestyle changes including avoidance of late meals, excessive alcohol, smoking cessation, and avoid fatty foods, chocolate, peppermint, colas, red wine, and acidic juices such as orange juice.  NO MINT OR MENTHOL PRODUCTS SO NO COUGH DROPS  USE SUGARLESS CANDY INSTEAD (Jolley ranchers or Stover's or Life Savers) or even ice chips will also do - the key is to swallow to prevent all throat clearing. NO OIL BASED VITAMINS - use powdered substitutes.    Please see patient coordinator before you leave today  to schedule v/q lung scan, venous dopplers   Please remember to go to the lab and x-ray department downstairs for your tests - we will call you with the results when they are available.  Please schedule a follow up office visit in  2 weeks, sooner if needed to go over all results and progress your findings

## 2014-05-24 NOTE — Assessment & Plan Note (Signed)
See venous doppler 03/08/13  Findings consistent with acute deep vein thrombosis involving the common femoral vein of the left lower extremity - repeat venous dopplers  05/24/2014 >>>   Clinically resolved but at the very least need to be sure about venous system before dismissing possibility of recurrent PE as can't do CTa due to cri.

## 2014-05-24 NOTE — Assessment & Plan Note (Signed)
Lab Results  Component Value Date   CREATININE 1.69* 05/24/2014   CREATININE 1.60* 03/08/2013   CREATININE 1.78* 03/07/2013     Note absence of anemia or met acidosis or obvious vol overload so doubt contributing to sob

## 2014-05-24 NOTE — Assessment & Plan Note (Signed)
-   V/Q 03/17/13  - repeat v/q ordered 05/24/2014 >>>

## 2014-05-24 NOTE — Progress Notes (Signed)
Subjective:    Patient ID: Jacqueline Zuniga, female    DOB: 05-12-1925,  MRN: [id number]  HPI   37 yowf minimal smoking hx with PE 02/2013 never really better breathing since then unable to [facility] s doe / can't shop due to back   05/24/2014 1st [facility] office visit/ Varian Innes  Re doe Chief Complaint  Patient presents with  . Pulmonary Consult    Referred by Dr. Peter Martinique. Pt c/o SOB for the past 2 yrs- worse since Dec 2015. She gets SOB when doing her housework and sometimes when she bends over.   never at rest or noct sob Onset was initially abrupt with PE but no change from that point, doesn' t really feel she improved on anticoagulation and no worse off it.   No obvious  day to day or daytime variabilty or assoc chronic cough or cp or chest tightness, subjective wheeze overt sinus or hb symptoms. No unusual exp hx or h/o childhood pna/ asthma or knowledge of premature birth.  Sleeping ok without nocturnal  or early am exacerbation  of respiratory  c/o's or need for noct saba. Also denies any obvious fluctuation of symptoms with weather or environmental changes or other aggravating or alleviating factors except as outlined above   Current Medications, Allergies, Complete Past Medical History, Past Surgical History, Family History, and Social History were reviewed in [employer] record.             Review of Systems  Constitutional: Negative for fever, chills and unexpected weight change.  HENT: Negative for congestion, dental problem, ear pain, nosebleeds, postnasal drip, rhinorrhea, sinus pressure, sneezing, sore throat, trouble swallowing and voice change.   Eyes: Negative for visual disturbance.  Respiratory: Positive for shortness of breath. Negative for cough and choking.   Cardiovascular: Positive for leg swelling. Negative for chest pain.  Gastrointestinal: Negative for vomiting, abdominal pain and diarrhea.  Genitourinary: Negative for  difficulty urinating.  Musculoskeletal: Positive for arthralgias.  Skin: Negative for rash.  Neurological: Negative for tremors, syncope and headaches.  Hematological: Does not bruise/bleed easily.       Objective:   Physical Exam  Anxious wf extremely hard of hearing  Wt Readings from Last 3 Encounters:  05/24/14 170 lb (77.111 kg)  01/24/14 177 lb (80.287 kg)  04/30/13 181 lb (82.101 kg)    Vital signs reviewed  HEENT: nl dentition, turbinates, and orophanx. Nl external ear canals without cough reflex   NECK :  without JVD/Nodes/TM/ nl carotid upstrokes bilaterally   LUNGS: no acc muscle use, clear to A and P bilaterally without cough on insp or exp maneuvers   CV:  RRR  no s3 or murmur or increase in P2, no edema   ABD:  soft and nontender with nl excursion in the supine position. No bruits or organomegaly, bowel sounds nl  MS:  warm without deformities, calf tenderness, cyanosis or clubbing  SKIN: warm and dry without lesions    NEURO:  alert, approp, no deficits    .CXR PA and Lateral:   05/24/2014 :     I personally reviewed images and agree with radiology impression as follows:    Hyperinflation consistent with COPD or reactive airway disease. There is no evidence of pneumonia, CHF, nor other acute cardiopulmonary abnormality.    Labs ordered/ reviewed :    Recent Labs Lab 05/24/14 1545  NA 143  K 4.1  CL 105  CO2 31  BUN 45*  CREATININE 1.69*  GLUCOSE 135*    Recent Labs Lab 05/24/14 1545  HGB 13.7  HCT 41.3  WBC 7.7  PLT 274.0     Lab Results  Component Value Date   TSH 1.87 05/24/2014     Lab Results  Component Value Date   PROBNP 311.0* 05/24/2014             Assessment & Plan:

## 2014-05-27 ENCOUNTER — Telehealth: Payer: Self-pay | Admitting: Internal Medicine

## 2014-05-27 NOTE — Telephone Encounter (Signed)
Notes Recorded by Tanda Rockers, MD on 05/24/2014 at 7:42 PM Call pt: Reviewed cxr and no acute change so no change in recommendations made at [facility]  Notes Recorded by Tanda Rockers, MD on 05/24/2014 at 7:42 PM Call patient : Studies are unremarkable, no change in recs ----------------------------------------------------------  Spoke with pt, she is aware of lab and cxr results.  Nothing further needed.

## 2014-05-27 NOTE — Progress Notes (Signed)
Quick Note:  LMTCB ______ 

## 2014-05-27 NOTE — Progress Notes (Signed)
Quick Note:  Pt aware of results. ______ 

## 2014-05-30 ENCOUNTER — Ambulatory Visit (HOSPITAL_COMMUNITY): Payer: Medicare Other | Attending: Internal Medicine | Admitting: Cardiology

## 2014-05-30 DIAGNOSIS — Z86711 Personal history of pulmonary embolism: Secondary | ICD-10-CM

## 2014-05-30 DIAGNOSIS — R0600 Dyspnea, unspecified: Secondary | ICD-10-CM

## 2014-05-30 DIAGNOSIS — M79605 Pain in left leg: Secondary | ICD-10-CM

## 2014-05-30 DIAGNOSIS — Z86718 Personal history of other venous thrombosis and embolism: Secondary | ICD-10-CM

## 2014-05-30 NOTE — Progress Notes (Signed)
Bilateral lower extremity venous duplex performed 

## 2014-05-31 ENCOUNTER — Telehealth: Payer: Self-pay | Admitting: Cardiology

## 2014-05-31 NOTE — Progress Notes (Signed)
Quick Note:  lmtcb ______ 

## 2014-06-03 ENCOUNTER — Ambulatory Visit (HOSPITAL_COMMUNITY)
Admission: RE | Admit: 2014-06-03 | Discharge: 2014-06-03 | Disposition: A | Discharge status: Home / Self Care | Payer: Medicare Other | Source: Ambulatory Visit | Attending: Internal Medicine | Admitting: Internal Medicine

## 2014-06-03 DIAGNOSIS — R0600 Dyspnea, unspecified: Secondary | ICD-10-CM

## 2014-06-03 DIAGNOSIS — Z86711 Personal history of pulmonary embolism: Secondary | ICD-10-CM

## 2014-06-03 DIAGNOSIS — R0602 Shortness of breath: Secondary | ICD-10-CM | POA: Insufficient documentation

## 2014-06-03 MED ORDER — TECHNETIUM TO 99M ALBUMIN AGGREGATED
6.0000 | Freq: Once | INTRAVENOUS | Status: AC | PRN
  Administered 2014-06-03: 6 via INTRAVENOUS

## 2014-06-03 MED ORDER — TECHNETIUM TC 99M DIETHYLENETRIAME-PENTAACETIC ACID: 40.0000 | Freq: Once | INTRAVENOUS | Status: AC | PRN

## 2014-06-03 NOTE — Telephone Encounter (Signed)
Close encounter 

## 2014-06-04 ENCOUNTER — Telehealth: Payer: Self-pay | Admitting: Internal Medicine

## 2014-06-04 NOTE — Progress Notes (Signed)
Quick Note:  LMTCB ______ 

## 2014-06-04 NOTE — Telephone Encounter (Signed)
Results have been explained to patient, pt expressed understanding. Nothing further needed.  Notes Recorded by Rosana Berger, CMA on 06/04/2014 at 8:59 AM [facility] ------ Notes Recorded by Tanda Rockers, MD on 06/03/2014 at 5:40 PM Call patient : Study is unremarkable, improved vs previous studies

## 2014-06-10 ENCOUNTER — Ambulatory Visit (INDEPENDENT_AMBULATORY_CARE_PROVIDER_SITE_OTHER): Payer: Medicare Other | Admitting: Internal Medicine

## 2014-06-10 ENCOUNTER — Encounter: Payer: Self-pay | Admitting: Internal Medicine

## 2014-06-10 VITALS — BP 120/58 | HR 60 | Ht 62.0 in | Wt 172.8 lb

## 2014-06-10 DIAGNOSIS — I824Y9 Acute embolism and thrombosis of unspecified deep veins of unspecified proximal lower extremity: Secondary | ICD-10-CM

## 2014-06-10 DIAGNOSIS — Z86711 Personal history of pulmonary embolism: Secondary | ICD-10-CM

## 2014-06-10 DIAGNOSIS — R0600 Dyspnea, unspecified: Secondary | ICD-10-CM

## 2014-06-10 NOTE — Progress Notes (Signed)
Subjective:    Patient ID: Jacqueline Zuniga, female    DOB: March 16, 1926,  MRN: [id number]    Brief patient profile:  46 yowf minimal smoking hx with PE 02/2013 never really better breathing since then unable to houswork s doe / can't shop due to back with mild PAH by Echo    History of Present Illness  05/24/2014 1st [facility] office visit/ Jacqueline Zuniga  Re doe Chief Complaint  Patient presents with  . Pulmonary Consult    Referred by Dr. Peter Zuniga. Pt c/o SOB for the past 2 yrs- worse since Dec 2015. She gets SOB when doing her housework and sometimes when she bends over.   never at rest or noct sob/ already tried increase lasix > no change  Onset was initially abrupt with PE but no change from that point, doesn' t really feel she improved on anticoagulation and no worse off it rec Take prilosec  30- 60 min before your first and last meals of the day  GERD diet Please see patient coordinator before you leave today  to schedule v/q lung scan, venous dopplers > neg         06/10/2014 f/u ov/Jacqueline Zuniga re: unexplained sob p pe with marked improvement in v/q and neg venous dopplers  Chief Complaint  Patient presents with  . Follow-up    Pt states that her breathing is some better. No new co's today.    Does [employer] ok but can't do neighborhood due to no one to walk with her any more  No obvious day to day or daytime variabilty or assoc chronic cough or cp or chest tightness, subjective wheeze overt sinus or hb symptoms. No unusual exp hx or h/o childhood pna/ asthma or knowledge of premature birth.  Sleeping ok without nocturnal  or early am exacerbation  of respiratory  c/o's or need for noct saba. Also denies any obvious fluctuation of symptoms with weather or environmental changes or other aggravating or alleviating factors except as outlined above   Current Medications, Allergies, Complete Past Medical History, Past Surgical History, Family History, and Social History were reviewed in  [employer] record.  ROS  The following are not active complaints unless bolded sore throat, dysphagia, dental problems, itching, sneezing,  nasal congestion or excess/ purulent secretions, ear ache,   fever, chills, sweats, unintended wt loss, pleuritic or exertional cp, hemoptysis,  orthopnea pnd or leg swelling, presyncope, palpitations, heartburn, abdominal pain, anorexia, nausea, vomiting, diarrhea  or change in bowel or urinary habits, change in stools or urine, dysuria,hematuria,  rash, arthralgias, visual complaints, headache, numbness weakness or ataxia or problems with walking or coordination,  change in mood/affect or memory.                           Objective:   Physical Exam  Anxious wf extremely hard of hearing/ walks with cane, needs help getting up on exam table due to strength, not breathing   06/10/2014        173  Wt Readings from Last 3 Encounters:  05/24/14 170 lb (77.111 kg)  01/24/14 177 lb (80.287 kg)  04/30/13 181 lb (82.101 kg)    Vital signs reviewed  HEENT: nl dentition, turbinates, and orophanx. Nl external ear canals without cough reflex   NECK :  without JVD/Nodes/TM/ nl carotid upstrokes bilaterally   LUNGS: no acc muscle use, clear to A and P bilaterally without cough on insp  or exp maneuvers   CV:  RRR  no s3 or murmur or increase in P2, no edema   ABD:  soft and nontender with nl excursion in the supine position. No bruits or organomegaly, bowel sounds nl  MS:  warm without deformities, calf tenderness, cyanosis or clubbing  SKIN: warm and dry without lesions         .CXR PA and Lateral:   05/24/2014 :     I personally reviewed images and agree with radiology impression as follows:    Hyperinflation consistent with COPD or reactive airway disease. There is no evidence of pneumonia, CHF, nor other acute cardiopulmonary abnormality.    Labs ordered/ reviewed :  Lab 05/24/14 1545  NA 143  K 4.1  CL  105  CO2 31  BUN 45*  CREATININE 1.69*  GLUCOSE 135*    Recent Labs Lab 05/24/14 1545  HGB 13.7  HCT 41.3  WBC 7.7  PLT 274.0     Lab Results  Component Value Date   TSH 1.87 05/24/2014     Lab Results  Component Value Date   PROBNP 311.0* 05/24/2014             Assessment & Plan:

## 2014-06-10 NOTE — Assessment & Plan Note (Signed)
-   V/Q 03/17/13  Pos high prob - Echo 05/30/14  with PAS 33 and mild RVE - repeat v/q  06/03/2014 > improved vs previous/ overall low prob PE  Really very little to support PAH much less TEPAH >  Next step in w/u is PFT then consider RHC if dx remains in doubt.    Watching her walk today I was more impressed with fatigue than sob   See instructions for specific recommendations which were reviewed directly with the patient who was given a copy with highlighter outlining the key components.

## 2014-06-10 NOTE — Patient Instructions (Addendum)
Stay on same meds until you return   Please schedule a follow up office visit in 4 weeks, sooner if needed with pfts on return and then we'll decide next step

## 2014-06-10 NOTE — Assessment & Plan Note (Signed)
-   05/24/2014   Walked RA x one lap @ 185 stopped due to min sob, nl pace, no desats  - 06/10/2014   Walked RA x one lap @ 185 stopped due to  Tired> sob/ nl pace, no desats   Really evidence of a pulmonary limitation to activity  but needs pfts to be complete > scheduled

## 2014-06-10 NOTE — Assessment & Plan Note (Signed)
See venous doppler 03/08/13  Findings consistent with acute deep vein thrombosis involving the common femoral vein of the left lower extremity - repeat venous dopplers  3/3//2016 >>> neg bilaterally   No further rx needed

## 2014-06-17 ENCOUNTER — Other Ambulatory Visit: Payer: Self-pay | Admitting: Cardiology

## 2014-07-15 ENCOUNTER — Ambulatory Visit (INDEPENDENT_AMBULATORY_CARE_PROVIDER_SITE_OTHER): Payer: Medicare Other | Admitting: Internal Medicine

## 2014-07-15 ENCOUNTER — Encounter: Payer: Self-pay | Admitting: Internal Medicine

## 2014-07-15 VITALS — BP 120/62 | HR 60 | Ht 62.0 in | Wt 175.0 lb

## 2014-07-15 DIAGNOSIS — R0600 Dyspnea, unspecified: Secondary | ICD-10-CM

## 2014-07-15 LAB — PULMONARY FUNCTION TEST
DL/VA % PRED: 97 %
DL/VA: 4.41 ml/min/mmHg/L
DLCO unc % pred: 64 %
DLCO unc: 13.82 ml/min/mmHg
FEF 25-75 PRE: 0.89 L/s
FEF 25-75 Post: 1.44 L/sec
FEF2575-%Change-Post: 61 %
FEF2575-%PRED-POST: 156 %
FEF2575-%PRED-PRE: 97 %
FEV1-%Change-Post: 12 %
FEV1-%PRED-POST: 94 %
FEV1-%Pred-Pre: 84 %
FEV1-Post: 1.41 L
FEV1-Pre: 1.26 L
FEV1FVC-%CHANGE-POST: 1 %
FEV1FVC-%Pred-Pre: 102 %
FEV6-%Change-Post: 10 %
FEV6-%Pred-Post: 99 %
FEV6-%Pred-Pre: 89 %
FEV6-Post: 1.89 L
FEV6-Pre: 1.71 L
FEV6FVC-%Change-Post: 0 %
FEV6FVC-%PRED-POST: 107 %
FEV6FVC-%Pred-Pre: 107 %
FVC-%Change-Post: 10 %
FVC-%PRED-PRE: 83 %
FVC-%Pred-Post: 92 %
FVC-POST: 1.89 L
FVC-Pre: 1.71 L
POST FEV6/FVC RATIO: 100 %
Post FEV1/FVC ratio: 75 %
Pre FEV1/FVC ratio: 74 %
Pre FEV6/FVC Ratio: 100 %
RV % pred: 74 %
RV: 1.81 L
TLC % PRED: 77 %
TLC: 3.69 L

## 2014-07-15 NOTE — Progress Notes (Signed)
PFT done today. 

## 2014-07-15 NOTE — Progress Notes (Signed)
Subjective:    Patient ID: Jacqueline Zuniga, female    DOB: 1926/01/03   MRN: [id number]     Brief patient profile:  25 yowf minimal smoking hx with PE 02/2013 never really better breathing since then unable to housework s doe / can't shop due to back with mild PAH by Echo  01/29/14 with nl pfts 07/15/14   History of Present Illness  05/24/2014 1st [facility] office visit/ Wert  Re doe Chief Complaint  Patient presents with  . Pulmonary Consult    Referred by Dr. Peter Martinique. Pt c/o SOB for the past 2 yrs- worse since Dec 2015. She gets SOB when doing her housework and sometimes when she bends over.   never at rest or noct sob/ already tried increase lasix > no change  Onset was initially abrupt with PE but no change from that point, doesn' t really feel she improved on anticoagulation and no worse off it rec Take prilosec  30- 60 min before your first and last meals of the day  GERD diet Please see patient coordinator before you leave today  to schedule v/q lung scan, venous dopplers > neg         06/10/2014 f/u ov/Wert re: unexplained sob p pe with marked improvement in v/q and neg venous dopplers  Chief Complaint  Patient presents with  . Follow-up    Pt states that her breathing is some better. No new co's today.    Does [employer] ok but can't do neighborhood due to no one to walk with her any more rec No change rx     07/15/2014 f/u ov/Wert re: doe/ noct hypoexemia on 2lpm hs per Dr Martinique   Chief Complaint  Patient presents with  . Follow-up    PFT done today. Breathing continues to improve. No new co's.   Not limited by breathing from desired activities  Except sob bending over Never sob at rest and ok sleeping on 2lpm   No obvious day to day or daytime variabilty or assoc chronic cough or cp or chest tightness, subjective wheeze overt sinus or hb symptoms. No unusual exp hx or h/o childhood pna/ asthma or knowledge of premature birth.  Sleeping ok without  nocturnal  or early am exacerbation  of respiratory  c/o's or need for noct saba. Also denies any obvious fluctuation of symptoms with weather or environmental changes or other aggravating or alleviating factors except as outlined above   Current Medications, Allergies, Complete Past Medical History, Past Surgical History, Family History, and Social History were reviewed in [employer] [facility] record.  ROS  The following are not active complaints unless bolded sore throat, dysphagia, dental problems, itching, sneezing,  nasal congestion or excess/ purulent secretions, ear ache,   fever, chills, sweats, unintended wt loss, pleuritic or exertional cp, hemoptysis,  orthopnea pnd or leg swelling, presyncope, palpitations, heartburn, abdominal pain, anorexia, nausea, vomiting, diarrhea  or change in bowel or urinary habits, change in stools or urine, dysuria,hematuria,  rash, arthralgias, visual complaints, headache, numbness weakness or ataxia or problems with walking or coordination,  change in mood/affect or memory.                           Objective:   Physical Exam  Anxious wf extremely hard of hearing/ walks with cane, needs help getting up on exam table due to strength, not breathing   06/10/2014  173 > 07/15/2014    175 Wt Readings from Last 3 Encounters:  05/24/14 170 lb (77.111 kg)  01/24/14 177 lb (80.287 kg)  04/30/13 181 lb (82.101 kg)    Vital signs reviewed  HEENT: nl dentition, turbinates, and orophanx. Nl external ear canals without cough reflex   NECK :  without JVD/Nodes/TM/ nl carotid upstrokes bilaterally   LUNGS: no acc muscle use, clear to A and P bilaterally without cough on insp or exp maneuvers   CV:  RRR  no s3 or murmur or increase in P2, no edema   ABD:  soft and nontender with nl excursion in the supine position. No bruits or organomegaly, bowel sounds nl  MS:  warm without deformities, calf tenderness, cyanosis or  clubbing  SKIN: warm and dry without lesions         CXR PA and Lateral:   05/24/2014 :     I personally reviewed images and agree with radiology impression as follows:   Hyperinflation consistent with COPD or reactive airway disease. There is no evidence of pneumonia, CHF, nor other acute cardiopulmonary abnormality.    Labs  reviewed :  Lab 05/24/14 1545  NA 143  K 4.1  CL 105  CO2 31  BUN 45*  CREATININE 1.69*  GLUCOSE 135*    Recent Labs Lab 05/24/14 1545  HGB 13.7  HCT 41.3  WBC 7.7  PLT 274.0     Lab Results  Component Value Date   TSH 1.87 05/24/2014     Lab Results  Component Value Date   PROBNP 311.0* 05/24/2014             Assessment & Plan:

## 2014-07-15 NOTE — Patient Instructions (Signed)
Avoid bending over at waist   I will tell Dr Martinique and Philip Aspen your lungs are in good shape but there are certain medications that may affect your breathing and need to be used with caution  Pulmonary follow up is as needed

## 2014-07-17 ENCOUNTER — Encounter: Payer: Self-pay | Admitting: Internal Medicine

## 2014-07-17 NOTE — Assessment & Plan Note (Addendum)
-   05/24/2014   Walked RA x one lap @ 185 stopped due to min sob, nl pace, no desats  - 06/10/2014   Walked RA x one lap @ 185 stopped due to  Tired> sob/ nl pace, no desats  - 07/15/14  PFTs nl x dlco 64% corrects to 97 for alv vol   I had an extended summary discussion with the patient reviewing all relevant studies completed to date and  lasting 15 to 20 minutes of a 25 minute visit on the following ongoing concerns:   1) her only real sob occurs bending over which is not a lung problem and suggest she bend her legs to reach down  2) she does have very subtle airflow obst which is not clinically relevant but would avoid high doses of non-selective BB in this setting   3) Each maintenance medication was reviewed in detail including most importantly the difference between maintenance and as needed and under what circumstances the prns are to be used.  Please see instructions for details which were reviewed in writing and the patient given a copy.   4) no pulmonary meds needed >>> pulmonary f/u can be prn

## 2014-08-12 ENCOUNTER — Ambulatory Visit (INDEPENDENT_AMBULATORY_CARE_PROVIDER_SITE_OTHER): Payer: Medicare Other | Admitting: Cardiology

## 2014-08-12 ENCOUNTER — Encounter: Payer: Self-pay | Admitting: Cardiology

## 2014-08-12 VITALS — BP 130/58 | HR 63 | Ht 62.0 in | Wt 173.0 lb

## 2014-08-12 DIAGNOSIS — I5022 Chronic systolic (congestive) heart failure: Secondary | ICD-10-CM | POA: Diagnosis not present

## 2014-08-12 DIAGNOSIS — Z86711 Personal history of pulmonary embolism: Secondary | ICD-10-CM

## 2014-08-12 DIAGNOSIS — I2510 Atherosclerotic heart disease of native coronary artery without angina pectoris: Secondary | ICD-10-CM

## 2014-08-12 DIAGNOSIS — R0600 Dyspnea, unspecified: Secondary | ICD-10-CM

## 2014-08-12 NOTE — Patient Instructions (Signed)
Continue your current therapy  I will see you in 6 months.   

## 2014-08-13 NOTE — Progress Notes (Signed)
[name] Date of Birth: 1925/10/27   History of Present Illness: Jacqueline Zuniga is seen for followup CAD She has a history of coronary disease and is status post stenting of the LAD in 2005. Her last stress test in February 2010 was normal. She also a history of diabetes mellitus, hypertension, and hyperlipidemia. She was admitted in December 2014 with an acute PE and DVT. Because of CKD she was not a candidate for one of the newer anticoagulants and was placed on coumadin. She has persistent dyspnea on exertion doing house chores and bending over.  No edema or cough. No chest pain. She was evaluated by [name]. PFTs were normal. V/Q scan showed improvement in right lung perfusion. CXR was ok. LE venous dopplers were negative for DVT. No further recommendations.    Current Outpatient Prescriptions on File Prior to Visit  Medication Sig Dispense Refill  . amLODipine (NORVASC) 2.5 MG tablet TAKE 1 TABLET ONCE A DAY 30 tablet 6  . carvedilol (COREG) 12.5 MG tablet TAKE 1 TABLET TWICE DAILY WITH BREAKFAST & SUPPER 60 tablet 6  . fenofibrate 160 MG tablet TAKE 1 TABLET ONCE A DAY 90 tablet 0  . furosemide (LASIX) 40 MG tablet Take 1 tablet (40 mg total) by mouth daily. 30 tablet 6  . gabapentin (NEURONTIN) 300 MG capsule Take 300 mg by mouth 4 (four) times daily.    . hydrocodone-acetaminophen (LORCET-HD) 5-500 MG per capsule Take 1 capsule by mouth every 6 (six) hours as needed for pain.     Marland Kitchen insulin NPH (HUMULIN N,NOVOLIN N) 100 UNIT/ML injection Inject 16-26 Units into the skin 2 (two) times daily at 8 am and 10 pm. Take 26 units with breakfast and 16 units with dinner 1 vial 12  . multivitamin (THERAGRAN) per tablet Take 1 tablet by mouth daily.      . nitroGLYCERIN (NITROSTAT) 0.4 MG SL tablet Place 1 tablet (0.4 mg total) under the tongue every 5 (five) minutes as needed. 90 tablet 3  . omeprazole (PRILOSEC) 20 MG capsule Take 30- 60 min before your first and last meals of the day 90  capsule 1  . ONE TOUCH ULTRA TEST test strip     . pravastatin (PRAVACHOL) 40 MG tablet Take 1 tablet (40 mg total) by mouth daily. 90 tablet 3  . Vitamin D, Ergocalciferol, (DRISDOL) 50000 UNITS CAPS capsule Take 50,000 Units by mouth every 30 (thirty) days.     No current facility-administered medications on file prior to visit.    Allergies  Allergen Reactions  . Morphine   . Morphine And Related Nausea And Vomiting    Past Medical History  Diagnosis Date  . Hypertension   . Hypercholesterolemia   . H/O: GI bleed     a. Anemia 2004: EGD showing a partially healed duodenal ulcer. b. Ulcerative reflux esophagitis and schatzki ring by EGD 2010.  Marland Kitchen Coronary artery disease     a. s/p LAD stent 03/2003. b. Recurrent CP and abnormal nuc s/p CABGx2 (LIMA-LAD, SVG-Diag) in 10/2003. c. Last nuc 2010 reportedly normal per [name] note.  . Chronic systolic CHF (congestive heart failure)     a. EF 35-40% by echo 2005;  b. 02/2013 Echo: EF 50-55%, mildly dil LA/RA.  . hx: breast cancer, right IDC ER + PR - Her 2 -, left paget's disease/DCIS receptor -  08/12/2011    Patient diagnosed with right breast invasive ductal carcinoma 04/25/2008. Patient underwent right partial mastectomy on 05/21/2008.  Pathology showed invasive ductal carcinoma, margins negative. ER positive at 99%. PR negative. Her 2 negative. Ki 67 at 25%. Patient then diagnosed with left nipple paget's disease on 04/01/2010. Patient underwent left lumpectomy on 04/30/2010. Pathology showed paget's dise  . CKD (chronic kidney disease)   . DVT of deep femoral vein     a. 02/2013 acute DVT of left common femoral vein-->coumadin initiated.  . Pulmonary embolism     a. 02/2013 intermediate to high risk V:Q scan-->coumadin initiated.  . Type II diabetes mellitus   . Chronic anemia   . History of blood transfusion     "several"   . GERD (gastroesophageal reflux disease)   . Arthritis     "left shoulder; hips" (03/07/2013)  . Chronic  lower back pain   . Lumbar stenosis   . Uterine cancer 1966  . Breast cancer     "both breasts"   . Skin cancer of face     "forehead and both temples"   . Schatzki's ring   . Anticoagulated on Coumadin     a. 02/2013 LCFV DVT/PE.    Past Surgical History  Procedure Laterality Date  . Shoulder arthroscopy w/ rotator cuff repair Left   . Breast lumpectomy Right 2010  . Mastectomy partial / lumpectomy Left 2012  . Coronary artery bypass graft  2005    CABG X2  . Coronary angioplasty with stent placement  04/01/2003    1. Left heart catheterization. 2. Coronary angiography  3. Left ventricular angiography. 4 . Stenting of the mid left anterior descending.  . Abdominal hysterectomy  1966  . Appendectomy  1966  . Cataract extraction w/ intraocular lens  implant, bilateral Bilateral ~ 2007  . Dilation and curettage of uterus      History  Smoking status  . Former Smoker -- 0.50 packs/day for 4 years  . Types: Cigarettes  . Quit date: 03/29/1964  Smokeless tobacco  . Never Used    History  Alcohol Use No    Family History  Problem Relation Age of Onset  . Heart failure Mother   . Heart disease Mother   . Heart attack Brother   . Coronary artery disease Father   . Heart failure Sister   . Stroke Brother     had bypass surgery    Review of Systems: As noted in history of present illness. All other systems were reviewed and are negative.  Physical Exam: BP 130/58 mmHg  Pulse 63  Ht 5\' 2"  (1.575 m)  Wt 173 lb (78.472 kg)  BMI 31.63 kg/m2 Oxygen sat 91%. She is an overweight white female in no acute distress. HEENT exam is unremarkable.  Sclera are clear. PERRL, oropharynx is clear.  She has no JVD or bruits. Lungs are clear. Cardiac exam reveals a regular rate and rhythm without gallop, murmur, or click. Abdomen is soft and nontender. She has no edema. Pedal pulses are good. She has trace edema. She is alert oriented x3. Cranial nerves II through XII are  intact.  LABORATORY: Ecg: NSR with LAD, NSIVCD, old septal infarct. I have personally reviewed and interpreted this study.  Echo: 01/29/14:Study Conclusions  - Left ventricle: The cavity size was normal. Wall thickness was normal. Systolic function was mildly reduced. The estimated ejection fraction was in the range of 45% to 50%. Basal to mid inferior hypokinesis. Features are consistent with a pseudonormal left ventricular filling pattern, with concomitant abnormal relaxation and increased filling pressure (grade 2 diastolic dysfunction). E/medial  e&' > 15 suggests LV end diastolic pressure at least 20 mmHg. - Ventricular septum: Mildly D-shaped IV septum suggestive of RV pressure/volume overload. - Aortic valve: There was no stenosis. - Mitral valve: Mildly calcified annulus. There was mild regurgitation. - Left atrium: The atrium was mildly dilated. - Right ventricle: The cavity size was mildly to moderately dilated. Systolic function was mildly reduced. - Right atrium: The atrium was mildly dilated. - Tricuspid valve: Peak RV-RA gradient (S): 25 mm Hg. - Pulmonary arteries: PA peak pressure: 33 mm Hg (S). - Systemic veins: IVC measured 2.4 cm with > 50% respirophasic variation, suggesting RA pressure around 8 mmHg.  Impressions:  - Technically difficult study with poor acoustic windows. Normal LV size with EF 45-50%. Basal to mid inferior hypokinesis. The RV was poorly visualized. It was probably mild to moderately dilated with mild hypokinesis. There was some D-shape to the interventricular septum suggesting RV pressure/volume overload. The peak estimated PA pressure was 33 mmHg but the envelope was incomplete so not sure this is particularly accurate.  Assessment / Plan: 1. History of PE/DVT in December 2014. Now on chronic coumadin. No evidence of right heart failure by exam but persistently SOB. By Echo she has mild Right heart  enlargement and mild pulmonary HTN. Continue use of nocturnal oxygen since overnight oximetry in past showed decreased sats.   2. Coronary disease status post remote stenting of the LAD.  Continue  carvedilol. Risk factor modification.  Last stress test 5 years ago. Since she is asymptomatic will follow clinically. No ASA since she is on coumadin.  2. Hypertension, well controlled on amlodipine. She has a history of cough on ACE inhibitors. Renal insufficiency on Diovan HCT.   3. Diabetes mellitus type 2.  4. Hypercholesterolemia.  She reports that her primary care told her she had atrial fibrillation. I reviewed her records and see no history of AFib except perhaps immediately post CABG. She does need to continue coumadin long term due to unprovoked DVT/PE.  I will follow up in 6 months.

## 2014-09-12 ENCOUNTER — Other Ambulatory Visit: Payer: Self-pay | Admitting: Cardiology

## 2014-09-12 NOTE — Telephone Encounter (Signed)
Rx(s) sent to pharmacy electronically.  

## 2014-09-18 ENCOUNTER — Other Ambulatory Visit: Payer: Self-pay | Admitting: Internal Medicine

## 2014-09-18 ENCOUNTER — Other Ambulatory Visit: Payer: Self-pay | Admitting: Cardiology

## 2014-09-18 NOTE — Telephone Encounter (Signed)
Rx(s) sent to pharmacy electronically.  

## 2014-09-24 ENCOUNTER — Other Ambulatory Visit: Payer: Self-pay | Admitting: Cardiology

## 2014-09-24 NOTE — Telephone Encounter (Signed)
REFILL 

## 2014-12-30 ENCOUNTER — Other Ambulatory Visit: Payer: Self-pay | Admitting: Internal Medicine

## 2014-12-31 ENCOUNTER — Other Ambulatory Visit: Payer: Self-pay | Admitting: Internal Medicine

## 2015-01-14 ENCOUNTER — Telehealth: Payer: Self-pay

## 2015-01-14 NOTE — Telephone Encounter (Signed)
Received form from [place] for O2 medical necessity.Form completed and faxed to fax # [phone].

## 2015-03-20 ENCOUNTER — Other Ambulatory Visit: Payer: Self-pay | Admitting: Cardiology

## 2015-03-20 NOTE — Telephone Encounter (Signed)
REFILL 

## 2015-04-23 ENCOUNTER — Other Ambulatory Visit: Payer: Self-pay

## 2015-04-23 ENCOUNTER — Ambulatory Visit (INDEPENDENT_AMBULATORY_CARE_PROVIDER_SITE_OTHER): Payer: Medicare Other | Admitting: Cardiology

## 2015-04-23 ENCOUNTER — Encounter: Payer: Self-pay | Admitting: Cardiology

## 2015-04-23 VITALS — BP 130/52 | HR 58 | Ht 62.0 in | Wt 164.6 lb

## 2015-04-23 DIAGNOSIS — I2510 Atherosclerotic heart disease of native coronary artery without angina pectoris: Secondary | ICD-10-CM | POA: Diagnosis not present

## 2015-04-23 DIAGNOSIS — Z86711 Personal history of pulmonary embolism: Secondary | ICD-10-CM

## 2015-04-23 DIAGNOSIS — E7800 Pure hypercholesterolemia, unspecified: Secondary | ICD-10-CM | POA: Diagnosis not present

## 2015-04-23 DIAGNOSIS — I5022 Chronic systolic (congestive) heart failure: Secondary | ICD-10-CM

## 2015-04-23 MED ORDER — FUROSEMIDE 40 MG PO TABS: 40.0000 mg | ORAL_TABLET | Freq: Every day | ORAL | Status: DC

## 2015-04-23 MED ORDER — AMLODIPINE BESYLATE 2.5 MG PO TABS: 2.5000 mg | ORAL_TABLET | Freq: Every day | ORAL | Status: DC

## 2015-04-23 MED ORDER — OMEPRAZOLE 20 MG PO CPDR: DELAYED_RELEASE_CAPSULE | ORAL | Status: AC

## 2015-04-23 MED ORDER — CARVEDILOL 12.5 MG PO TABS: ORAL_TABLET | ORAL | Status: DC

## 2015-04-23 MED ORDER — FENOFIBRATE 160 MG PO TABS: 160.0000 mg | ORAL_TABLET | Freq: Every day | ORAL | Status: DC

## 2015-04-23 MED ORDER — ASPIRIN EC 81 MG PO TBEC: 81.0000 mg | DELAYED_RELEASE_TABLET | Freq: Every day | ORAL | Status: DC

## 2015-04-23 MED ORDER — PRAVASTATIN SODIUM 40 MG PO TABS: 40.0000 mg | ORAL_TABLET | Freq: Every day | ORAL | Status: DC

## 2015-04-23 NOTE — Progress Notes (Signed)
Jacqueline Zuniga Date of Birth: 21-Jun-1925   History of Present Illness: Jacqueline Zuniga is seen for followup CAD She has a history of coronary disease and is status post stenting of the LAD in 2005. Her last stress test in February 2010 was normal. She also a history of diabetes mellitus, hypertension, and hyperlipidemia. She was admitted in December 2014 with an acute PE and DVT. Because of CKD she was not a candidate for one of the newer anticoagulants and was placed on coumadin. She had persistent dyspnea on exertion. She was evaluated by [name]. PFTs were normal. V/Q scan showed improvement in right lung perfusion. CXR was ok. LE venous dopplers were negative for DVT. No further recommendations.   On follow up today she is doing very well. She is off coumadin. She still uses oxygen at night. She does have some dyspnea on exertion but none at rest. She says that if it wasn't for her back she would feel excellent.   Current Outpatient Prescriptions on File Prior to Visit  Medication Sig Dispense Refill  . amLODipine (NORVASC) 2.5 MG tablet TAKE 1 TABLET ONCE A DAY 30 tablet 0  . carvedilol (COREG) 12.5 MG tablet TAKE 1 TABLET TWICE DAILY WITH BREAKFAST & SUPPER 60 tablet 6  . fenofibrate 160 MG tablet TAKE 1 TABLET ONCE A DAY 90 tablet 2  . furosemide (LASIX) 40 MG tablet TAKE 1 TABLET DAILY 30 tablet 6  . gabapentin (NEURONTIN) 300 MG capsule Take 300 mg by mouth 4 (four) times daily.    . hydrocodone-acetaminophen (LORCET-HD) 5-500 MG per capsule Take 1 capsule by mouth every 6 (six) hours as needed for pain.     Marland Kitchen insulin NPH (HUMULIN N,NOVOLIN N) 100 UNIT/ML injection Inject 16-26 Units into the skin 2 (two) times daily at 8 am and 10 pm. Take 26 units with breakfast and 16 units with dinner 1 vial 12  . multivitamin (THERAGRAN) per tablet Take 1 tablet by mouth daily.      . nitroGLYCERIN (NITROSTAT) 0.4 MG SL tablet Place 1 tablet (0.4 mg total) under the tongue every 5 (five) minutes as  needed. 90 tablet 3  . omeprazole (PRILOSEC) 20 MG capsule Take 30- 60 min before your first and last meals of the day 180 capsule 0  . ONE TOUCH ULTRA TEST test strip     . pravastatin (PRAVACHOL) 40 MG tablet Take 1 tablet (40 mg total) by mouth daily. 90 tablet 3  . Vitamin D, Ergocalciferol, (DRISDOL) 50000 UNITS CAPS capsule Take 50,000 Units by mouth every 30 (thirty) days.     No current facility-administered medications on file prior to visit.    Allergies  Allergen Reactions  . Morphine   . Morphine And Related Nausea And Vomiting    Past Medical History  Diagnosis Date  . Hypertension   . Hypercholesterolemia   . H/O: GI bleed     a. Anemia 2004: EGD showing a partially healed duodenal ulcer. b. Ulcerative reflux esophagitis and schatzki ring by EGD 2010.  Marland Kitchen Coronary artery disease     a. s/p LAD stent 03/2003. b. Recurrent CP and abnormal nuc s/p CABGx2 (LIMA-LAD, SVG-Diag) in 10/2003. c. Last nuc 2010 reportedly normal per [name] note.  . Chronic systolic CHF (congestive heart failure) ([place])     a. EF 35-40% by echo 2005;  b. 02/2013 Echo: EF 50-55%, mildly dil LA/RA.  . hx: breast cancer, right IDC ER + PR - Her 2 -,  left paget's disease/DCIS receptor -  08/12/2011    Patient diagnosed with right breast invasive ductal carcinoma 04/25/2008. Patient underwent right partial mastectomy on 05/21/2008. Pathology showed invasive ductal carcinoma, margins negative. ER positive at 99%. PR negative. Her 2 negative. Ki 67 at 25%. Patient then diagnosed with left nipple paget's disease on 04/01/2010. Patient underwent left lumpectomy on 04/30/2010. Pathology showed paget's dise  . CKD (chronic kidney disease)   . DVT of deep femoral vein ([place])     a. 02/2013 acute DVT of left common femoral vein-->coumadin initiated.  . Pulmonary embolism ([place])     a. 02/2013 intermediate to high risk V:Q scan-->coumadin initiated.  . Type II diabetes mellitus ([place])   . Chronic anemia   . History of  blood transfusion     "several"   . GERD (gastroesophageal reflux disease)   . Arthritis     "left shoulder; hips" (03/07/2013)  . Chronic lower back pain   . Lumbar stenosis   . Uterine cancer ([place]) 1966  . Breast cancer ([place])     "both breasts"   . Skin cancer of face     "forehead and both temples"   . Schatzki's ring   . Anticoagulated on Coumadin     a. 02/2013 LCFV DVT/PE.    Past Surgical History  Procedure Laterality Date  . Shoulder arthroscopy w/ rotator cuff repair Left   . Breast lumpectomy Right 2010  . Mastectomy partial / lumpectomy Left 2012  . Coronary artery bypass graft  2005    CABG X2  . Coronary angioplasty with stent placement  04/01/2003    1. Left heart catheterization. 2. Coronary angiography  3. Left ventricular angiography. 4 . Stenting of the mid left anterior descending.  . Abdominal hysterectomy  1966  . Appendectomy  1966  . Cataract extraction w/ intraocular lens  implant, bilateral Bilateral ~ 2007  . Dilation and curettage of uterus      History  Smoking status  . Former Smoker -- 0.50 packs/day for 4 years  . Types: Cigarettes  . Quit date: 03/29/1964  Smokeless tobacco  . Never Used    History  Alcohol Use No    Family History  Problem Relation Age of Onset  . Heart failure Mother   . Heart disease Mother   . Heart attack Brother   . Coronary artery disease Father   . Heart failure Sister   . Stroke Brother     had bypass surgery    Review of Systems: As noted in history of present illness. All other systems were reviewed and are negative.  Physical Exam: BP 130/52 mmHg  Pulse 58  Ht 5\' 2"  (1.575 m)  Wt 74.662 kg (164 lb 9.6 oz)  BMI 30.10 kg/m2 Oxygen sat 91%. She is an overweight white female in no acute distress. HEENT exam is unremarkable.  Sclera are clear. PERRL, oropharynx is clear.  She has no JVD or bruits. Lungs are clear. Cardiac exam reveals a regular rate and rhythm without gallop, murmur, or click.  Abdomen is soft and nontender. She has no edema. Pedal pulses are good. She has trace edema. She is alert oriented x3. Cranial nerves II through XII are intact.  LABORATORY: Ecg: today: NSR with LAD, NSIVCD, old septal infarct. Rate 58. I have personally reviewed and interpreted this study.  Assessment / Plan: 1. History of PE/DVT in December 2014.  No evidence of right heart failure by exam. By prior Echo she has mild  Right heart enlargement and mild pulmonary HTN. Continue use of nocturnal oxygen since overnight oximetry in past showed decreased sats.   2. Coronary disease status post remote stenting of the LAD.  Continue  carvedilol. Risk factor modification.   Since she is asymptomatic will follow clinically. Now that she is off coumadin she should resume ASA 81 mg daily.  2. Hypertension, well controlled on amlodipine and metoprolol. She has a history of cough on ACE inhibitors. Renal insufficiency on Diovan HCT.   3. Diabetes mellitus type 2.  4. Hypercholesterolemia.  I will follow up in 6 months.

## 2015-04-23 NOTE — Patient Instructions (Signed)
Resume ASA 81 mg daily  Continue your other therapy  I will see you in 6 months.

## 2015-05-06 ENCOUNTER — Other Ambulatory Visit: Payer: Self-pay | Admitting: Cardiology

## 2015-05-06 NOTE — Telephone Encounter (Signed)
Called in [place] 12.5mg , bid, 180 with 3 refills to [facility].

## 2015-12-18 ENCOUNTER — Encounter: Payer: Self-pay | Admitting: Cardiology

## 2015-12-18 ENCOUNTER — Ambulatory Visit (INDEPENDENT_AMBULATORY_CARE_PROVIDER_SITE_OTHER): Payer: Medicare Other | Admitting: Cardiology

## 2015-12-18 VITALS — BP 136/60 | HR 59 | Ht 62.0 in | Wt 166.2 lb

## 2015-12-18 DIAGNOSIS — I2510 Atherosclerotic heart disease of native coronary artery without angina pectoris: Secondary | ICD-10-CM | POA: Diagnosis not present

## 2015-12-18 DIAGNOSIS — I5022 Chronic systolic (congestive) heart failure: Secondary | ICD-10-CM | POA: Diagnosis not present

## 2015-12-18 DIAGNOSIS — I10 Essential (primary) hypertension: Secondary | ICD-10-CM

## 2015-12-18 DIAGNOSIS — E7800 Pure hypercholesterolemia, unspecified: Secondary | ICD-10-CM

## 2015-12-18 NOTE — Progress Notes (Signed)
Jacqueline Zuniga Date of Birth: 06-28-1925   History of Present Illness: Jacqueline Zuniga is seen for followup CAD She has a history of coronary disease and is status post stenting of the LAD in 2005. Her last stress test in February 2010 was normal. She also a history of diabetes mellitus, hypertension, and hyperlipidemia. She was admitted in December 2014 with an acute PE and DVT. Because of CKD she was not a candidate for one of the newer anticoagulants and was placed on coumadin. She had persistent dyspnea on exertion. She was evaluated by [name]. PFTs were normal. V/Q scan showed improvement in right lung perfusion. CXR was ok. LE venous dopplers were negative for DVT. No further recommendations.   On follow up today she reports she was admitted to [facility] in February with the flu, PNA, recurrent PE,and paroxysmal Afib. No records available today. She currently denies any chest pain, SOB, palpitations or edema. She is now on Xarelto. Still limited by back pain from spinal stenosis but does not want to have surgery.    Medication List       Accurate as of 12/18/15  3:51 PM. Always use your most recent med list.          amLODipine 2.5 MG tablet Commonly known as:  NORVASC Take 1 tablet (2.5 mg total) by mouth daily.   carvedilol 12.5 MG tablet Commonly known as:  COREG TAKE 1 TABLET TWICE DAILY WITH BREAKFAST & SUPPER   furosemide 40 MG tablet Commonly known as:  LASIX Take 1 tablet (40 mg total) by mouth daily.   gabapentin 300 MG capsule Commonly known as:  NEURONTIN Take 300 mg by mouth 4 (four) times daily.   HUMULIN N KWIKPEN 100 UNIT/ML Kiwkpen Generic drug:  Insulin NPH (Human) (Isophane) Inject 16-26 Units into the skin as directed.   multivitamin per tablet Take 1 tablet by mouth daily.   nitroGLYCERIN 0.4 MG SL tablet Commonly known as:  NITROSTAT Place 1 tablet (0.4 mg total) under the tongue every 5 (five) minutes as needed.   omeprazole 20 MG  capsule Commonly known as:  PRILOSEC Take 30- 60 min before your first and last meals of the day   ONE TOUCH ULTRA TEST test strip Generic drug:  glucose blood   pravastatin 40 MG tablet Commonly known as:  PRAVACHOL Take 1 tablet (40 mg total) by mouth daily.   rivaroxaban 20 MG Tabs tablet Commonly known as:  XARELTO Take 20 mg by mouth daily with supper.   Vitamin D (Ergocalciferol) 50000 units Caps capsule Commonly known as:  DRISDOL Take 50,000 Units by mouth every 30 (thirty) days.        Allergies  Allergen Reactions  . Morphine   . Morphine And Related Nausea And Vomiting    Past Medical History:  Diagnosis Date  . Anticoagulated on Coumadin    a. 02/2013 LCFV DVT/PE.  Marland Kitchen Arthritis    "left shoulder; hips" (03/07/2013)  . Breast cancer ([place])    "both breasts"   . Chronic anemia   . Chronic lower back pain   . Chronic systolic CHF (congestive heart failure) ([place])    a. EF 35-40% by echo 2005;  b. 02/2013 Echo: EF 50-55%, mildly dil LA/RA.  . CKD (chronic kidney disease)   . Coronary artery disease    a. s/p LAD stent 03/2003. b. Recurrent CP and abnormal nuc s/p CABGx2 (LIMA-LAD, SVG-Diag) in 10/2003. c. Last nuc 2010 reportedly normal per Dr.  Twilia Yaklin's note.  . DVT of deep femoral vein ([place])    a. 02/2013 acute DVT of left common femoral vein-->coumadin initiated.  Marland Kitchen GERD (gastroesophageal reflux disease)   . H/O: GI bleed    a. Anemia 2004: EGD showing a partially healed duodenal ulcer. b. Ulcerative reflux esophagitis and schatzki ring by EGD 2010.  Marland Kitchen History of blood transfusion    "several"   . hx: breast cancer, right IDC ER + PR - Her 2 -, left paget's disease/DCIS receptor -  08/12/2011   Patient diagnosed with right breast invasive ductal carcinoma 04/25/2008. Patient underwent right partial mastectomy on 05/21/2008. Pathology showed invasive ductal carcinoma, margins negative. ER positive at 99%. PR negative. Her 2 negative. Ki 67 at 25%. Patient then  diagnosed with left nipple paget's disease on 04/01/2010. Patient underwent left lumpectomy on 04/30/2010. Pathology showed paget's dise  . Hypercholesterolemia   . Hypertension   . Lumbar stenosis   . Pulmonary embolism ([place])    a. 02/2013 intermediate to high risk V:Q scan-->coumadin initiated.  . Schatzki's ring   . Skin cancer of face    "forehead and both temples"   . Type II diabetes mellitus ([place])   . Uterine cancer ([place]) 1966    Past Surgical History:  Procedure Laterality Date  . ABDOMINAL HYSTERECTOMY  1966  . APPENDECTOMY  1966  . BREAST LUMPECTOMY Right 2010  . CATARACT EXTRACTION W/ INTRAOCULAR LENS  IMPLANT, BILATERAL Bilateral ~ 2007  . CORONARY ANGIOPLASTY WITH STENT PLACEMENT  04/01/2003   1. Left heart catheterization. 2. Coronary angiography  3. Left ventricular angiography. 4 . Stenting of the mid left anterior descending.  . CORONARY ARTERY BYPASS GRAFT  2005   CABG X2  . DILATION AND CURETTAGE OF UTERUS    . MASTECTOMY PARTIAL / LUMPECTOMY Left 2012  . SHOULDER ARTHROSCOPY W/ ROTATOR CUFF REPAIR Left     History  Smoking Status  . Former Smoker  . Packs/day: 0.50  . Years: 4.00  . Types: Cigarettes  . Quit date: 03/29/1964  Smokeless Tobacco  . Never Used    History  Alcohol Use No    Family History  Problem Relation Age of Onset  . Heart failure Mother   . Heart disease Mother   . Heart attack Brother   . Coronary artery disease Father   . Heart failure Sister   . Stroke Brother     had bypass surgery    Review of Systems: As noted in history of present illness. All other systems were reviewed and are negative.  Physical Exam: BP 136/60   Pulse (!) 59   Ht 5\' 2"  (1.575 m)   Wt 166 lb 3.2 oz (75.4 kg)   SpO2 90%   BMI 30.40 kg/m  She is an overweight white female in no acute distress. HEENT exam is unremarkable.  Sclera are clear. PERRL, oropharynx is clear.  She has no JVD or bruits. Lungs are clear. Cardiac exam reveals a regular rate  and rhythm without gallop, murmur, or click. Abdomen is soft and nontender. She has no edema. Pedal pulses are good. She has trace edema. She is alert oriented x3. Cranial nerves II through XII are intact.  LABORATORY: Labs from primary care dated 12/11/15: cholesterol 178, triglycerides 158, LDL 95, HDL 51. BUN 30, creatinine 1.2. Other chemistries normal. A1c 7.8%. HCt and TSH normal.  Assessment / Plan: 1. History of PE/DVT in December 2014.  Apparent recurrence Feb. 2017. Now on chronic anticoagulation  with Xarelto. Needs to continue indefinitely.  Continue use of nocturnal oxygen since overnight oximetry in past showed decreased sats. Will request records from [facility].  2. Coronary disease status post remote stenting of the LAD.  Continue  carvedilol. Risk factor modification.   Since she is asymptomatic will follow clinically. Off ASA now that she is on Xarelto.   2. Hypertension, well controlled on amlodipine and metoprolol. She has a history of cough on ACE inhibitors. Renal insufficiency on Diovan HCT.   3. Diabetes mellitus type 2.  4. Hypercholesterolemia.  5. Paroxysmal Afib in setting of PNA and PE. Apparently converted to NSR. Pulse regular today.  I will follow up in 6 months.

## 2015-12-18 NOTE — Patient Instructions (Signed)
Continue your current therapy  We will get records from [facility].  I will see you in 6 months.

## 2016-02-09 ENCOUNTER — Other Ambulatory Visit: Payer: Self-pay | Admitting: Cardiology

## 2016-02-11 NOTE — Telephone Encounter (Signed)
Rx has been sent to the pharmacy electronically. ° °

## 2016-04-29 ENCOUNTER — Other Ambulatory Visit: Payer: Self-pay | Admitting: Cardiology

## 2016-05-18 ENCOUNTER — Other Ambulatory Visit: Payer: Self-pay | Admitting: Cardiology

## 2016-05-18 NOTE — Telephone Encounter (Signed)
Rx(s) sent to pharmacy electronically.  

## 2016-06-01 ENCOUNTER — Other Ambulatory Visit: Payer: Self-pay | Admitting: Cardiology

## 2016-06-16 ENCOUNTER — Encounter: Payer: Self-pay | Admitting: *Deleted

## 2016-06-28 ENCOUNTER — Ambulatory Visit: Payer: Medicare Other | Admitting: Cardiology

## 2016-08-09 ENCOUNTER — Other Ambulatory Visit: Payer: Self-pay | Admitting: *Deleted

## 2016-08-09 MED ORDER — RIVAROXABAN 20 MG PO TABS: 20.0000 mg | ORAL_TABLET | Freq: Every day | ORAL | 0 refills | Status: DC

## 2016-08-09 NOTE — Telephone Encounter (Signed)
Rx has been sent to the pharmacy electronically. ° °

## 2016-09-07 ENCOUNTER — Other Ambulatory Visit: Payer: Self-pay | Admitting: Cardiology

## 2016-09-10 NOTE — Progress Notes (Deleted)
Jacqueline Zuniga Date of Birth: 1926/02/05   History of Present Illness: Jacqueline Zuniga is seen for followup CAD She has a history of coronary disease and is status post stenting of the LAD in 2005. Her last stress test in February 2010 was normal. She also a history of diabetes mellitus, hypertension, and hyperlipidemia. She was admitted in December 2014 with an acute PE and DVT. Because of CKD she was not a candidate for one of the newer anticoagulants and was placed on coumadin. She had persistent dyspnea on exertion. She was evaluated by [name]. PFTs were normal. V/Q scan showed improvement in right lung perfusion. CXR was ok. LE venous dopplers were negative for DVT. No further recommendations.   On follow up today she reports she was admitted to [facility] in February with the flu, PNA, recurrent PE,and paroxysmal Afib. No records available today. She currently denies any chest pain, SOB, palpitations or edema. She is now on Xarelto. Still limited by back pain from spinal stenosis but does not want to have surgery.  Allergies as of 09/14/2016      Reactions   Morphine    Morphine And Related Nausea And Vomiting      Medication List       Accurate as of 09/10/16  7:42 AM. Always use your most recent med list.          amLODipine 2.5 MG tablet Commonly known as:  NORVASC TAKE ONE TABLET BY MOUTH ONCE DAILY   carvedilol 12.5 MG tablet Commonly known as:  COREG Take 1 tablet (12.5 mg total) by mouth 2 (two) times daily with a meal.   furosemide 40 MG tablet Commonly known as:  LASIX TAKE 1 TABLET DAILY   gabapentin 300 MG capsule Commonly known as:  NEURONTIN Take 300 mg by mouth 4 (four) times daily.   HUMULIN N KWIKPEN 100 UNIT/ML Kiwkpen Generic drug:  Insulin NPH (Human) (Isophane) Inject 16-26 Units into the skin as directed.   multivitamin per tablet Take 1 tablet by mouth daily.   nitroGLYCERIN 0.4 MG SL tablet Commonly known as:  NITROSTAT Place 1  tablet (0.4 mg total) under the tongue every 5 (five) minutes as needed.   omeprazole 20 MG capsule Commonly known as:  PRILOSEC Take 30- 60 min before your first and last meals of the day   ONE TOUCH ULTRA TEST test strip Generic drug:  glucose blood   pravastatin 40 MG tablet Commonly known as:  PRAVACHOL TAKE ONE TABLET BY MOUTH ONCE DAILY   rivaroxaban 20 MG Tabs tablet Commonly known as:  XARELTO Take 1 tablet (20 mg total) by mouth daily with supper.   Vitamin D (Ergocalciferol) 50000 units Caps capsule Commonly known as:  DRISDOL Take 50,000 Units by mouth every 30 (thirty) days.        Allergies  Allergen Reactions  . Morphine   . Morphine And Related Nausea And Vomiting    Past Medical History:  Diagnosis Date  . Anticoagulated on Coumadin    a. 02/2013 LCFV DVT/PE.  Marland Kitchen Arthritis    "left shoulder; hips" (03/07/2013)  . Breast cancer ([place])    "both breasts"   . Chronic anemia   . Chronic lower back pain   . Chronic systolic CHF (congestive heart failure) ([place])    a. EF 35-40% by echo 2005;  b. 02/2013 Echo: EF 50-55%, mildly dil LA/RA.  . CKD (chronic kidney disease)   . Coronary artery disease  a. s/p LAD stent 03/2003. b. Recurrent CP and abnormal nuc s/p CABGx2 (LIMA-LAD, SVG-Diag) in 10/2003. c. Last nuc 2010 reportedly normal per [name] note.  . DVT of deep femoral vein ([place])    a. 02/2013 acute DVT of left common femoral vein-->coumadin initiated.  Marland Kitchen GERD (gastroesophageal reflux disease)   . H/O: GI bleed    a. Anemia 2004: EGD showing a partially healed duodenal ulcer. b. Ulcerative reflux esophagitis and schatzki ring by EGD 2010.  Marland Kitchen History of blood transfusion    "several"   . hx: breast cancer, right IDC ER + PR - Her 2 -, left paget's disease/DCIS receptor -  08/12/2011   Patient diagnosed with right breast invasive ductal carcinoma 04/25/2008. Patient underwent right partial mastectomy on 05/21/2008. Pathology showed invasive ductal  carcinoma, margins negative. ER positive at 99%. PR negative. Her 2 negative. Ki 67 at 25%. Patient then diagnosed with left nipple paget's disease on 04/01/2010. Patient underwent left lumpectomy on 04/30/2010. Pathology showed paget's dise  . Hypercholesterolemia   . Hypertension   . Lumbar stenosis   . Pulmonary embolism ([place])    a. 02/2013 intermediate to high risk V:Q scan-->coumadin initiated.  . Schatzki's ring   . Skin cancer of face    "forehead and both temples"   . Type II diabetes mellitus ([place])   . Uterine cancer ([place]) 1966    Past Surgical History:  Procedure Laterality Date  . ABDOMINAL HYSTERECTOMY  1966  . APPENDECTOMY  1966  . BREAST LUMPECTOMY Right 2010  . CATARACT EXTRACTION W/ INTRAOCULAR LENS  IMPLANT, BILATERAL Bilateral ~ 2007  . CORONARY ANGIOPLASTY WITH STENT PLACEMENT  04/01/2003   1. Left heart catheterization. 2. Coronary angiography  3. Left ventricular angiography. 4 . Stenting of the mid left anterior descending.  . CORONARY ARTERY BYPASS GRAFT  2005   CABG X2  . DILATION AND CURETTAGE OF UTERUS    . MASTECTOMY PARTIAL / LUMPECTOMY Left 2012  . SHOULDER ARTHROSCOPY W/ ROTATOR CUFF REPAIR Left     History  Smoking Status  . Former Smoker  . Packs/day: 0.50  . Years: 4.00  . Types: Cigarettes  . Quit date: 03/29/1964  Smokeless Tobacco  . Never Used    History  Alcohol Use No    Family History  Problem Relation Age of Onset  . Heart failure Mother   . Heart disease Mother   . Congestive Heart Failure Mother   . Coronary artery disease Father   . Heart attack Brother   . CVA Brother   . Congestive Heart Failure Sister   . Heart attack Brother   . Heart failure Sister   . Stroke Brother        had bypass surgery    Review of Systems: As noted in history of present illness. All other systems were reviewed and are negative.  Physical Exam: There were no vitals taken for this visit. She is an overweight white female in no acute  distress. HEENT exam is unremarkable.  Sclera are clear. PERRL, oropharynx is clear.  She has no JVD or bruits. Lungs are clear. Cardiac exam reveals a regular rate and rhythm without gallop, murmur, or click. Abdomen is soft and nontender. She has no edema. Pedal pulses are good. She has trace edema. She is alert oriented x3. Cranial nerves II through XII are intact.  LABORATORY: Labs from primary care dated 12/11/15: cholesterol 178, triglycerides 158, LDL 95, HDL 51. BUN 30, creatinine 1.2. Other  chemistries normal. A1c 7.8%. HCt and TSH normal.  Assessment / Plan: 1. History of PE/DVT in December 2014.  Apparent recurrence Feb. 2017. Now on chronic anticoagulation with Xarelto. Needs to continue indefinitely.  Continue use of nocturnal oxygen since overnight oximetry in past showed decreased sats. Will request records from [facility].  2. Coronary disease status post remote stenting of the LAD.  Continue  carvedilol. Risk factor modification.   Since she is asymptomatic will follow clinically. Off ASA now that she is on Xarelto.   2. Hypertension, well controlled on amlodipine and metoprolol. She has a history of cough on ACE inhibitors. Renal insufficiency on Diovan HCT.   3. Diabetes mellitus type 2.  4. Hypercholesterolemia.  5. Paroxysmal Afib in setting of PNA and PE. Apparently converted to NSR. Pulse regular today.  I will follow up in 6 months.

## 2016-09-14 ENCOUNTER — Ambulatory Visit: Payer: Medicare Other | Admitting: Cardiology

## 2016-10-09 ENCOUNTER — Other Ambulatory Visit: Payer: Self-pay | Admitting: Cardiology

## 2016-10-11 NOTE — Telephone Encounter (Signed)
Needs recent blood work 

## 2016-10-12 NOTE — Telephone Encounter (Signed)
Patient called, she took her last dose yesterday, and is concerned about hitting "donut-hole" soon.  States she won't be able to afford it once that happens and she may just quit taking it.    Returned call and spoke with her, will refill today for 1 month.  Labs have been ordered to [facility] office for review of dose.  Questioned her about the "donut-hole" and she believes she will get by for another 2-3 months before she gets there.  Asked her to please call our office once she is there so that we can help her to stay on medication for remainder of 2018.  Patient voiced her understanding of the situation

## 2016-10-12 NOTE — Telephone Encounter (Signed)
Pt called stating she is completely out of Xarelto, needs refill sent in ASAP, advised pt we were awaiting lab results from primary MD last labs in chart 2016, to make sure Xarelto dosage correct, but will notify Cyril Mourning, PharmD pt is out of Xarelto and needs rx sent in today.  Pt also expressed concern that she is getting ready to enter the doughnut hole and she will not be able to pay $800 for her rx.  Discussed with Cyril Mourning, PharmD since this is a [facility] pt, she will call to discuss with pt.

## 2016-10-12 NOTE — Telephone Encounter (Signed)
Labs requested from [facility] 

## 2016-11-23 ENCOUNTER — Telehealth: Payer: Self-pay | Admitting: Cardiology

## 2016-11-23 NOTE — Telephone Encounter (Signed)
New message      Returning a call to the pharmacist regarding getting assistance with her xarelto.

## 2016-11-23 NOTE — Telephone Encounter (Signed)
Patient assistance questions go to MD RN

## 2016-11-24 NOTE — Telephone Encounter (Signed)
Spoke to patient.Advised to bring proof of her income to appointment with Dr.Jordan 12/02/16.Advised I will fill out patient assistance application for Xarelto and fax on 12/02/16.

## 2016-11-28 NOTE — Progress Notes (Signed)
Jacqueline Zuniga Date of Birth: 1925/10/06   History of Present Illness: Jacqueline Zuniga is seen for followup CAD She has a history of coronary disease and is status post stenting of the LAD in 2005. Her last stress test in February 2010 was normal. She also a history of diabetes mellitus, hypertension, and hyperlipidemia. She was admitted in December 2014 with an acute PE and DVT. Because of CKD she was not a candidate for one of the newer anticoagulants and was placed on coumadin. She was evaluated by [name]. PFTs were normal. V/Q scan showed improvement in right lung perfusion. CXR was ok. LE venous dopplers were negative for DVT. No further recommendations.    She was admitted to [facility] in February 2017 with the flu, PNA, recurrent PE,and paroxysmal Afib.   She is now on Xarelto. Still limited by back pain from spinal stenosis. She is wearing oxygen at night. Still feels quite SOB with house work or bending over. No edema or chest pain.  Allergies as of 12/02/2016      Reactions   Morphine    Morphine And Related Nausea And Vomiting      Medication List       Accurate as of 12/02/16  2:16 PM. Always use your most recent med list.          amLODipine 2.5 MG tablet Commonly known as:  NORVASC TAKE ONE TABLET BY MOUTH ONCE DAILY   carvedilol 12.5 MG tablet Commonly known as:  COREG Take 1 tablet (12.5 mg total) by mouth 2 (two) times daily with a meal.   furosemide 40 MG tablet Commonly known as:  LASIX TAKE 1 TABLET DAILY   gabapentin 300 MG capsule Commonly known as:  NEURONTIN Take 300 mg by mouth 4 (four) times daily.   HUMULIN N KWIKPEN 100 UNIT/ML Kiwkpen Generic drug:  Insulin NPH (Human) (Isophane) Inject 16-26 Units into the skin as directed.   multivitamin per tablet Take 1 tablet by mouth daily.   nitroGLYCERIN 0.4 MG SL tablet Commonly known as:  NITROSTAT Place 1 tablet (0.4 mg total) under the tongue every 5 (five) minutes as needed.    omeprazole 20 MG capsule Commonly known as:  PRILOSEC Take 30- 60 min before your first and last meals of the day   ONE TOUCH ULTRA TEST test strip Generic drug:  glucose blood   pravastatin 40 MG tablet Commonly known as:  PRAVACHOL TAKE ONE TABLET BY MOUTH ONCE DAILY   Vitamin D (Ergocalciferol) 50000 units Caps capsule Commonly known as:  DRISDOL Take 50,000 Units by mouth every 30 (thirty) days.   XARELTO 20 MG Tabs tablet Generic drug:  rivaroxaban TAKE 1 TABLET BY MOUTH ONCE DAILY WITH SUPPER        Allergies  Allergen Reactions  . Morphine   . Morphine And Related Nausea And Vomiting    Past Medical History:  Diagnosis Date  . Anticoagulated on Coumadin    a. 02/2013 LCFV DVT/PE.  Marland Kitchen Arthritis    "left shoulder; hips" (03/07/2013)  . Breast cancer ([place])    "both breasts"   . Chronic anemia   . Chronic lower back pain   . Chronic systolic CHF (congestive heart failure) ([place])    a. EF 35-40% by echo 2005;  b. 02/2013 Echo: EF 50-55%, mildly dil LA/RA.  . CKD (chronic kidney disease)   . Coronary artery disease    a. s/p LAD stent 03/2003. b. Recurrent CP and abnormal nuc  s/p CABGx2 (LIMA-LAD, SVG-Diag) in 10/2003. c. Last nuc 2010 reportedly normal per [name] note.  . DVT of deep femoral vein ([place])    a. 02/2013 acute DVT of left common femoral vein-->coumadin initiated.  Marland Kitchen GERD (gastroesophageal reflux disease)   . H/O: GI bleed    a. Anemia 2004: EGD showing a partially healed duodenal ulcer. b. Ulcerative reflux esophagitis and schatzki ring by EGD 2010.  Marland Kitchen History of blood transfusion    "several"   . hx: breast cancer, right IDC ER + PR - Her 2 -, left paget's disease/DCIS receptor -  08/12/2011   Patient diagnosed with right breast invasive ductal carcinoma 04/25/2008. Patient underwent right partial mastectomy on 05/21/2008. Pathology showed invasive ductal carcinoma, margins negative. ER positive at 99%. PR negative. Her 2 negative. Ki 67 at 25%.  Patient then diagnosed with left nipple paget's disease on 04/01/2010. Patient underwent left lumpectomy on 04/30/2010. Pathology showed paget's dise  . Hypercholesterolemia   . Hypertension   . Lumbar stenosis   . Pulmonary embolism ([place])    a. 02/2013 intermediate to high risk V:Q scan-->coumadin initiated.  . Schatzki's ring   . Skin cancer of face    "forehead and both temples"   . Type II diabetes mellitus ([place])   . Uterine cancer ([place]) 1966    Past Surgical History:  Procedure Laterality Date  . ABDOMINAL HYSTERECTOMY  1966  . APPENDECTOMY  1966  . BREAST LUMPECTOMY Right 2010  . CATARACT EXTRACTION W/ INTRAOCULAR LENS  IMPLANT, BILATERAL Bilateral ~ 2007  . CORONARY ANGIOPLASTY WITH STENT PLACEMENT  04/01/2003   1. Left heart catheterization. 2. Coronary angiography  3. Left ventricular angiography. 4 . Stenting of the mid left anterior descending.  . CORONARY ARTERY BYPASS GRAFT  2005   CABG X2  . DILATION AND CURETTAGE OF UTERUS    . MASTECTOMY PARTIAL / LUMPECTOMY Left 2012  . SHOULDER ARTHROSCOPY W/ ROTATOR CUFF REPAIR Left     History  Smoking Status  . Former Smoker  . Packs/day: 0.50  . Years: 4.00  . Types: Cigarettes  . Quit date: 03/29/1964  Smokeless Tobacco  . Never Used    History  Alcohol Use No    Family History  Problem Relation Age of Onset  . Heart failure Mother   . Heart disease Mother   . Congestive Heart Failure Mother   . Coronary artery disease Father   . Heart attack Brother   . CVA Brother   . Congestive Heart Failure Sister   . Heart attack Brother   . Heart failure Sister   . Stroke Brother        had bypass surgery    Review of Systems: As noted in history of present illness. All other systems were reviewed and are negative.  Physical Exam: BP 127/63   Pulse 61   Ht 5\' 2"  (1.575 m)   Wt 169 lb 6.4 oz (76.8 kg)   SpO2 (!) 86%   BMI 30.98 kg/m  She is an overweight white female in no acute distress. HEENT exam is  unremarkable.  Sclera are clear. PERRL, oropharynx is clear.  She has no JVD or bruits. Lungs are clear. Cardiac exam reveals a regular rate and rhythm without gallop, murmur, or click. Abdomen is soft and nontender. She has no edema. Pedal pulses are good. She has trace edema. She is alert oriented x3. Cranial nerves II through XII are intact.  LABORATORY: Labs from primary care dated  12/11/15: cholesterol 178, triglycerides 158, LDL 95, HDL 51. BUN 30, creatinine 1.2. Other chemistries normal. A1c 7.8%. HCt and TSH normal. Dated 11/01/16: A1c 7%. Creatinine 1.4. Other chemistries and CBC normal.  Assessment / Plan: 1. History of PE/DVT in December 2014.  Recurrence Feb. 2017. Now on chronic anticoagulation with Xarelto. Needs to continue indefinitely.  May want to consider wearing oxygen during the day since she does desat walking down the hall. She states she cannot afford Xarelto. Will give her some samples and see if she qualifies for patient assistance. If not may need to switch to coumadin.  2. Coronary disease status post remote stenting of the LAD.  Continue  carvedilol. Risk factor modification.   Since she is asymptomatic will follow clinically. Off ASA now that she is on Xarelto.   2. Hypertension, well controlled on amlodipine and metoprolol. She has a history of cough on ACE inhibitors. Renal insufficiency on Diovan HCT.   3. Diabetes mellitus type 2.  4. Hypercholesterolemia.  5. Paroxysmal Afib in setting of PNA and PE.  Pulse regular today.  I will follow up in 6 months.

## 2016-12-02 ENCOUNTER — Ambulatory Visit (INDEPENDENT_AMBULATORY_CARE_PROVIDER_SITE_OTHER): Payer: Medicare Other | Admitting: Cardiology

## 2016-12-02 ENCOUNTER — Encounter: Payer: Self-pay | Admitting: Cardiology

## 2016-12-02 VITALS — BP 127/63 | HR 61 | Ht 62.0 in | Wt 169.4 lb

## 2016-12-02 DIAGNOSIS — E7800 Pure hypercholesterolemia, unspecified: Secondary | ICD-10-CM | POA: Diagnosis not present

## 2016-12-02 DIAGNOSIS — I2510 Atherosclerotic heart disease of native coronary artery without angina pectoris: Secondary | ICD-10-CM

## 2016-12-02 DIAGNOSIS — I10 Essential (primary) hypertension: Secondary | ICD-10-CM | POA: Diagnosis not present

## 2016-12-02 DIAGNOSIS — Z86711 Personal history of pulmonary embolism: Secondary | ICD-10-CM

## 2016-12-03 ENCOUNTER — Telehealth: Payer: Self-pay

## 2016-12-03 ENCOUNTER — Other Ambulatory Visit: Payer: Self-pay | Admitting: Cardiology

## 2016-12-03 NOTE — Telephone Encounter (Signed)
Rx has been sent to the pharmacy electronically. ° °

## 2016-12-03 NOTE — Telephone Encounter (Signed)
Patient assistance form completed for Xarelto.Form faxed to fax # [phone].

## 2016-12-22 ENCOUNTER — Telehealth: Payer: Self-pay | Admitting: Cardiology

## 2016-12-22 NOTE — Telephone Encounter (Signed)
Spoke to patient advised I have not heard back from patient assistance.Advised I will check with patient assistance this week and call her back.

## 2016-12-22 NOTE — Telephone Encounter (Signed)
New Message   pt verbalized that she is calling for rn   She said that she has a few questions to ask  She didn't disclose anything further

## 2016-12-22 NOTE — Telephone Encounter (Signed)
Patient called to ask if we have received anything back from Xarelto patient assistance. Advise patient she would receive a return phone call with an update.

## 2016-12-31 NOTE — Telephone Encounter (Signed)
Spoke to [employer] patient assistance.They received form,but they need to know if patient filed taxes last year.Spoke to patient she stated she did file taxes.She will mail me her tax return.

## 2017-01-20 MED ORDER — RIVAROXABAN 20 MG PO TABS: ORAL_TABLET | ORAL | 6 refills | Status: AC

## 2017-01-20 NOTE — Telephone Encounter (Signed)
Spoke to patient.She stated she received a card from [employer] they will cover Xarelto until the end of this year.Stated she needs a written prescription.Xarelto prescription mailed to patient.

## 2017-02-15 ENCOUNTER — Other Ambulatory Visit: Payer: Self-pay | Admitting: Cardiology

## 2017-03-21 ENCOUNTER — Other Ambulatory Visit: Payer: Self-pay | Admitting: Cardiology

## 2017-04-07 DIAGNOSIS — I10 Essential (primary) hypertension: Secondary | ICD-10-CM | POA: Diagnosis not present

## 2017-04-07 DIAGNOSIS — I7389 Other specified peripheral vascular diseases: Secondary | ICD-10-CM | POA: Diagnosis not present

## 2017-04-07 DIAGNOSIS — E1151 Type 2 diabetes mellitus with diabetic peripheral angiopathy without gangrene: Secondary | ICD-10-CM | POA: Diagnosis not present

## 2017-04-07 DIAGNOSIS — I480 Paroxysmal atrial fibrillation: Secondary | ICD-10-CM | POA: Diagnosis not present

## 2017-04-07 DIAGNOSIS — N183 Chronic kidney disease, stage 3 (moderate): Secondary | ICD-10-CM | POA: Diagnosis not present

## 2017-04-07 DIAGNOSIS — I2510 Atherosclerotic heart disease of native coronary artery without angina pectoris: Secondary | ICD-10-CM | POA: Diagnosis not present

## 2017-04-07 DIAGNOSIS — E7849 Other hyperlipidemia: Secondary | ICD-10-CM | POA: Diagnosis not present

## 2017-04-07 DIAGNOSIS — M545 Low back pain: Secondary | ICD-10-CM | POA: Diagnosis not present

## 2017-04-07 DIAGNOSIS — Z1389 Encounter for screening for other disorder: Secondary | ICD-10-CM | POA: Diagnosis not present

## 2017-04-12 ENCOUNTER — Other Ambulatory Visit: Payer: Self-pay | Admitting: Cardiology

## 2017-04-28 DIAGNOSIS — B351 Tinea unguium: Secondary | ICD-10-CM | POA: Diagnosis not present

## 2017-04-28 DIAGNOSIS — L84 Corns and callosities: Secondary | ICD-10-CM | POA: Diagnosis not present

## 2017-04-28 DIAGNOSIS — E1142 Type 2 diabetes mellitus with diabetic polyneuropathy: Secondary | ICD-10-CM | POA: Diagnosis not present

## 2017-05-30 DIAGNOSIS — I2510 Atherosclerotic heart disease of native coronary artery without angina pectoris: Secondary | ICD-10-CM | POA: Diagnosis not present

## 2017-05-30 DIAGNOSIS — R0600 Dyspnea, unspecified: Secondary | ICD-10-CM | POA: Diagnosis not present

## 2017-05-30 DIAGNOSIS — I5022 Chronic systolic (congestive) heart failure: Secondary | ICD-10-CM | POA: Diagnosis not present

## 2017-06-06 NOTE — Progress Notes (Deleted)
Jacqueline Zuniga Date of Birth: March 08, 1926   History of Present Illness: Jacqueline Zuniga is seen for followup CAD She has a history of coronary disease and is status post stenting of the LAD in 2005. Her last stress test in February 2010 was normal. She also a history of diabetes mellitus, hypertension, and hyperlipidemia. She was admitted in December 2014 with an acute PE and DVT. Because of CKD she was not a candidate for one of the newer anticoagulants and was placed on coumadin. She was evaluated by [name]. PFTs were normal. V/Q scan showed improvement in right lung perfusion. CXR was ok. LE venous dopplers were negative for DVT. No further recommendations.    She was admitted to [facility] in February 2017 with the flu, PNA, recurrent PE,and paroxysmal Afib.   She is now on Xarelto. Still limited by back pain from spinal stenosis. She is wearing oxygen at night. Still feels quite SOB with house work or bending over. No edema or chest pain.  Allergies as of 06/10/2017      Reactions   Morphine    Morphine And Related Nausea And Vomiting      Medication List        Accurate as of 06/06/17 12:57 PM. Always use your most recent med list.          amLODipine 2.5 MG tablet Commonly known as:  NORVASC TAKE ONE TABLET BY MOUTH ONCE DAILY   carvedilol 12.5 MG tablet Commonly known as:  COREG Take 1 tablet (12.5 mg total) by mouth 2 (two) times daily with a meal.   furosemide 40 MG tablet Commonly known as:  LASIX TAKE 1 TABLET DAILY   gabapentin 300 MG capsule Commonly known as:  NEURONTIN Take 300 mg by mouth 4 (four) times daily.   HUMULIN N KWIKPEN 100 UNIT/ML Kiwkpen Generic drug:  Insulin NPH (Human) (Isophane) Inject 16-26 Units into the skin as directed.   multivitamin per tablet Take 1 tablet by mouth daily.   nitroGLYCERIN 0.4 MG SL tablet Commonly known as:  NITROSTAT Place 1 tablet (0.4 mg total) under the tongue every 5 (five) minutes as needed.     omeprazole 20 MG capsule Commonly known as:  PRILOSEC Take 30- 60 min before your first and last meals of the day   ONE TOUCH ULTRA TEST test strip Generic drug:  glucose blood   pravastatin 40 MG tablet Commonly known as:  PRAVACHOL TAKE ONE TABLET BY MOUTH ONCE DAILY   rivaroxaban 20 MG Tabs tablet Commonly known as:  XARELTO TAKE 1 TABLET BY MOUTH ONCE DAILY WITH SUPPER   Vitamin D (Ergocalciferol) 50000 units Caps capsule Commonly known as:  DRISDOL Take 50,000 Units by mouth every 30 (thirty) days.        Allergies  Allergen Reactions  . Morphine   . Morphine And Related Nausea And Vomiting    Past Medical History:  Diagnosis Date  . Anticoagulated on Coumadin    a. 02/2013 LCFV DVT/PE.  Marland Kitchen Arthritis    "left shoulder; hips" (03/07/2013)  . Breast cancer ([place])    "both breasts"   . Chronic anemia   . Chronic lower back pain   . Chronic systolic CHF (congestive heart failure) ([place])    a. EF 35-40% by echo 2005;  b. 02/2013 Echo: EF 50-55%, mildly dil LA/RA.  . CKD (chronic kidney disease)   . Coronary artery disease    a. s/p LAD stent 03/2003. b. Recurrent CP and  abnormal nuc s/p CABGx2 (LIMA-LAD, SVG-Diag) in 10/2003. c. Last nuc 2010 reportedly normal per [name] note.  . DVT of deep femoral vein ([place])    a. 02/2013 acute DVT of left common femoral vein-->coumadin initiated.  Marland Kitchen GERD (gastroesophageal reflux disease)   . H/O: GI bleed    a. Anemia 2004: EGD showing a partially healed duodenal ulcer. b. Ulcerative reflux esophagitis and schatzki ring by EGD 2010.  Marland Kitchen History of blood transfusion    "several"   . hx: breast cancer, right IDC ER + PR - Her 2 -, left paget's disease/DCIS receptor -  08/12/2011   Patient diagnosed with right breast invasive ductal carcinoma 04/25/2008. Patient underwent right partial mastectomy on 05/21/2008. Pathology showed invasive ductal carcinoma, margins negative. ER positive at 99%. PR negative. Her 2 negative. Ki 67 at  25%. Patient then diagnosed with left nipple paget's disease on 04/01/2010. Patient underwent left lumpectomy on 04/30/2010. Pathology showed paget's dise  . Hypercholesterolemia   . Hypertension   . Lumbar stenosis   . Pulmonary embolism ([place])    a. 02/2013 intermediate to high risk V:Q scan-->coumadin initiated.  . Schatzki's ring   . Skin cancer of face    "forehead and both temples"   . Type II diabetes mellitus ([place])   . Uterine cancer ([place]) 1966    Past Surgical History:  Procedure Laterality Date  . ABDOMINAL HYSTERECTOMY  1966  . APPENDECTOMY  1966  . BREAST LUMPECTOMY Right 2010  . CATARACT EXTRACTION W/ INTRAOCULAR LENS  IMPLANT, BILATERAL Bilateral ~ 2007  . CORONARY ANGIOPLASTY WITH STENT PLACEMENT  04/01/2003   1. Left heart catheterization. 2. Coronary angiography  3. Left ventricular angiography. 4 . Stenting of the mid left anterior descending.  . CORONARY ARTERY BYPASS GRAFT  2005   CABG X2  . DILATION AND CURETTAGE OF UTERUS    . MASTECTOMY PARTIAL / LUMPECTOMY Left 2012  . SHOULDER ARTHROSCOPY W/ ROTATOR CUFF REPAIR Left     Social History   Tobacco Use  Smoking Status Former Smoker  . Packs/day: 0.50  . Years: 4.00  . Pack years: 2.00  . Types: Cigarettes  . Last attempt to quit: 03/29/1964  . Years since quitting: 53.2  Smokeless Tobacco Never Used    Social History   Substance and Sexual Activity  Alcohol Use No    Family History  Problem Relation Age of Onset  . Heart failure Mother   . Heart disease Mother   . Congestive Heart Failure Mother   . Coronary artery disease Father   . Heart attack Brother   . CVA Brother   . Congestive Heart Failure Sister   . Heart attack Brother   . Heart failure Sister   . Stroke Brother        had bypass surgery    Review of Systems: As noted in history of present illness. All other systems were reviewed and are negative.  Physical Exam: There were no vitals taken for this visit. She is an overweight  white female in no acute distress. HEENT exam is unremarkable.  Sclera are clear. PERRL, oropharynx is clear.  She has no JVD or bruits. Lungs are clear. Cardiac exam reveals a regular rate and rhythm without gallop, murmur, or click. Abdomen is soft and nontender. She has no edema. Pedal pulses are good. She has trace edema. She is alert oriented x3. Cranial nerves II through XII are intact.  LABORATORY: Labs from primary care dated 12/11/15: cholesterol  178, triglycerides 158, LDL 95, HDL 51. BUN 30, creatinine 1.2. Other chemistries normal. A1c 7.8%. HCt and TSH normal. Dated 11/01/16: A1c 7%. Creatinine 1.4. Other chemistries and CBC normal.  Assessment / Plan: 1. History of PE/DVT in December 2014.  Recurrence Feb. 2017. Now on chronic anticoagulation with Xarelto. Needs to continue indefinitely.  May want to consider wearing oxygen during the day since she does desat walking down the hall. She states she cannot afford Xarelto. Will give her some samples and see if she qualifies for patient assistance. If not may need to switch to coumadin.  2. Coronary disease status post remote stenting of the LAD.  Continue  carvedilol. Risk factor modification.   Since she is asymptomatic will follow clinically. Off ASA now that she is on Xarelto.   2. Hypertension, well controlled on amlodipine and metoprolol. She has a history of cough on ACE inhibitors. Renal insufficiency on Diovan HCT.   3. Diabetes mellitus type 2.  4. Hypercholesterolemia.  5. Paroxysmal Afib in setting of PNA and PE.  Pulse regular today.  I will follow up in 6 months.

## 2017-06-10 ENCOUNTER — Ambulatory Visit: Payer: Medicare Other | Admitting: Cardiology

## 2017-08-02 NOTE — Progress Notes (Deleted)
Jacqueline Zuniga Date of Birth: 1925/04/22   History of Present Illness: Jacqueline Zuniga is seen for followup CAD She has a history of coronary disease and is status post stenting of the LAD in 2005. Her last stress test in February 2010 was normal. She also a history of diabetes mellitus, hypertension, and hyperlipidemia. She was admitted in December 2014 with an acute PE and DVT. Because of CKD she was not a candidate for one of the newer anticoagulants and was placed on coumadin. She was evaluated by Jacqueline Zuniga. PFTs were normal. V/Q scan showed improvement in right lung perfusion. CXR was ok. LE venous dopplers were negative for DVT. No further recommendations.    She was admitted to [facility] in February 2017 with the flu, PNA, recurrent PE,and paroxysmal Afib.   She is now on Xarelto. Still limited by back pain from spinal stenosis. She is wearing oxygen at night. Still feels quite SOB with house work or bending over. No edema or chest pain.  Allergies as of 08/04/2017      Reactions   Morphine    Morphine And Related Nausea And Vomiting      Medication List        Accurate as of 08/02/17  1:40 PM. Always use your most recent med list.          amLODipine 2.5 MG tablet Commonly known as:  NORVASC TAKE ONE TABLET BY MOUTH ONCE DAILY   carvedilol 12.5 MG tablet Commonly known as:  COREG Take 1 tablet (12.5 mg total) by mouth 2 (two) times daily with a meal.   furosemide 40 MG tablet Commonly known as:  LASIX TAKE 1 TABLET DAILY   gabapentin 300 MG capsule Commonly known as:  NEURONTIN Take 300 mg by mouth 4 (four) times daily.   HUMULIN N KWIKPEN 100 UNIT/ML Kiwkpen Generic drug:  Insulin NPH (Human) (Isophane) Inject 16-26 Units into the skin as directed.   multivitamin per tablet Take 1 tablet by mouth daily.   nitroGLYCERIN 0.4 MG SL tablet Commonly known as:  NITROSTAT Place 1 tablet (0.4 mg total) under the tongue every 5 (five) minutes as needed.     omeprazole 20 MG capsule Commonly known as:  PRILOSEC Take 30- 60 min before your first and last meals of the day   ONE TOUCH ULTRA TEST test strip Generic drug:  glucose blood   pravastatin 40 MG tablet Commonly known as:  PRAVACHOL TAKE ONE TABLET BY MOUTH ONCE DAILY   rivaroxaban 20 MG Tabs tablet Commonly known as:  XARELTO TAKE 1 TABLET BY MOUTH ONCE DAILY WITH SUPPER   Vitamin D (Ergocalciferol) 50000 units Caps capsule Commonly known as:  DRISDOL Take 50,000 Units by mouth every 30 (thirty) days.        Allergies  Allergen Reactions  . Morphine   . Morphine And Related Nausea And Vomiting    Past Medical History:  Diagnosis Date  . Anticoagulated on Coumadin    a. 02/2013 LCFV DVT/PE.  Marland Kitchen Arthritis    "left shoulder; hips" (03/07/2013)  . Breast cancer ([place])    "both breasts"   . Chronic anemia   . Chronic lower back pain   . Chronic systolic CHF (congestive heart failure) ([place])    a. EF 35-40% by echo 2005;  b. 02/2013 Echo: EF 50-55%, mildly dil LA/RA.  . CKD (chronic kidney disease)   . Coronary artery disease    a. s/p LAD stent 03/2003. b. Recurrent CP  and abnormal nuc s/p CABGx2 (LIMA-LAD, SVG-Diag) in 10/2003. c. Last nuc 2010 reportedly normal per Jacqueline Zuniga note.  . DVT of deep femoral vein ([place])    a. 02/2013 acute DVT of left common femoral vein-->coumadin initiated.  Marland Kitchen GERD (gastroesophageal reflux disease)   . H/O: GI bleed    a. Anemia 2004: EGD showing a partially healed duodenal ulcer. b. Ulcerative reflux esophagitis and schatzki ring by EGD 2010.  Marland Kitchen History of blood transfusion    "several"   . hx: breast cancer, right IDC ER + PR - Her 2 -, left paget's disease/DCIS receptor -  08/12/2011   Patient diagnosed with right breast invasive ductal carcinoma 04/25/2008. Patient underwent right partial mastectomy on 05/21/2008. Pathology showed invasive ductal carcinoma, margins negative. ER positive at 99%. PR negative. Her 2 negative. Ki 67 at  25%. Patient then diagnosed with left nipple paget's disease on 04/01/2010. Patient underwent left lumpectomy on 04/30/2010. Pathology showed paget's dise  . Hypercholesterolemia   . Hypertension   . Lumbar stenosis   . Pulmonary embolism ([place])    a. 02/2013 intermediate to high risk V:Q scan-->coumadin initiated.  . Schatzki's ring   . Skin cancer of face    "forehead and both temples"   . Type II diabetes mellitus ([place])   . Uterine cancer ([place]) 1966    Past Surgical History:  Procedure Laterality Date  . ABDOMINAL HYSTERECTOMY  1966  . APPENDECTOMY  1966  . BREAST LUMPECTOMY Right 2010  . CATARACT EXTRACTION W/ INTRAOCULAR LENS  IMPLANT, BILATERAL Bilateral ~ 2007  . CORONARY ANGIOPLASTY WITH STENT PLACEMENT  04/01/2003   1. Left heart catheterization. 2. Coronary angiography  3. Left ventricular angiography. 4 . Stenting of the mid left anterior descending.  . CORONARY ARTERY BYPASS GRAFT  2005   CABG X2  . DILATION AND CURETTAGE OF UTERUS    . MASTECTOMY PARTIAL / LUMPECTOMY Left 2012  . SHOULDER ARTHROSCOPY W/ ROTATOR CUFF REPAIR Left     Social History   Tobacco Use  Smoking Status Former Smoker  . Packs/day: 0.50  . Years: 4.00  . Pack years: 2.00  . Types: Cigarettes  . Last attempt to quit: 03/29/1964  . Years since quitting: 53.3  Smokeless Tobacco Never Used    Social History   Substance and Sexual Activity  Alcohol Use No    Family History  Problem Relation Age of Onset  . Heart failure Mother   . Heart disease Mother   . Congestive Heart Failure Mother   . Coronary artery disease Father   . Heart attack Brother   . CVA Brother   . Congestive Heart Failure Sister   . Heart attack Brother   . Heart failure Sister   . Stroke Brother        had bypass surgery    Review of Systems: As noted in history of present illness. All other systems were reviewed and are negative.  Physical Exam: There were no vitals taken for this visit. She is an overweight  white female in no acute distress. HEENT exam is unremarkable.  Sclera are clear. PERRL, oropharynx is clear.  She has no JVD or bruits. Lungs are clear. Cardiac exam reveals a regular rate and rhythm without gallop, murmur, or click. Abdomen is soft and nontender. She has no edema. Pedal pulses are good. She has trace edema. She is alert oriented x3. Cranial nerves II through XII are intact.  LABORATORY: Labs from primary care dated 12/11/15:  cholesterol 178, triglycerides 158, LDL 95, HDL 51. BUN 30, creatinine 1.2. Other chemistries normal. A1c 7.8%. HCt and TSH normal. Dated 11/01/16: A1c 7%. Creatinine 1.4. Other chemistries and CBC normal.  Assessment / Plan: 1. History of PE/DVT in December 2014.  Recurrence Feb. 2017. Now on chronic anticoagulation with Xarelto. Needs to continue indefinitely.  May want to consider wearing oxygen during the day since she does desat walking down the hall. She states she cannot afford Xarelto. Will give her some samples and see if she qualifies for patient assistance. If not may need to switch to coumadin.  2. Coronary disease status post remote stenting of the LAD.  Continue  carvedilol. Risk factor modification.   Since she is asymptomatic will follow clinically. Off ASA now that she is on Xarelto.   2. Hypertension, well controlled on amlodipine and metoprolol. She has a history of cough on ACE inhibitors. Renal insufficiency on Diovan HCT.   3. Diabetes mellitus type 2.  4. Hypercholesterolemia.  5. Paroxysmal Afib in setting of PNA and PE.  Pulse regular today.  I will follow up in 6 months.

## 2017-08-04 ENCOUNTER — Ambulatory Visit: Payer: Self-pay | Admitting: Cardiology

## 2017-08-20 ENCOUNTER — Other Ambulatory Visit: Payer: Self-pay | Admitting: Cardiology

## 2017-08-23 NOTE — Telephone Encounter (Signed)
Rx sent to pharmacy   

## 2017-11-22 ENCOUNTER — Other Ambulatory Visit: Payer: Self-pay | Admitting: Cardiology

## 2017-11-22 NOTE — Telephone Encounter (Signed)
Rx sent to pharmacy   

## 2017-12-03 NOTE — Progress Notes (Signed)
Jacqueline Zuniga Date of Birth: [birth year]   History of Present Illness: Jacqueline Zuniga is seen for followup CAD She has a history of coronary disease and is status post stenting of the LAD in 2005. Her last stress test in February 2010 was normal. She also a history of diabetes mellitus, hypertension, and hyperlipidemia. She was admitted in December 2014 with an acute PE and DVT. Because of CKD she was not a candidate for one of the newer anticoagulants and was placed on coumadin. She was evaluated by Jacqueline Zuniga. PFTs were normal. V/Q scan showed improvement in right lung perfusion. CXR was ok. LE venous dopplers were negative for DVT. No further recommendations.    She was admitted to [facility] in February 2017 with the flu, PNA, recurrent PE,and paroxysmal Afib.   She is now on Xarelto. Still limited by back pain from spinal stenosis. She is SOB just walking across the room. States something was seen on eye exam and Jacqueline Zuniga wanted to get carotid dopplers. She is wearing oxygen more.  Still feels quite SOB with house work or bending over.   Allergies  Allergen Reactions  . Morphine   . Morphine And Related Nausea And Vomiting    Past Medical History:  Diagnosis Date  . Anticoagulated on Coumadin    a. 02/2013 LCFV DVT/PE.  Marland Kitchen Arthritis    "left shoulder; hips" (03/07/2013)  . Breast cancer ([place])    "both breasts"   . Chronic anemia   . Chronic lower back pain   . Chronic systolic CHF (congestive heart failure) ([place])    a. EF 35-40% by echo 2005;  b. 02/2013 Echo: EF 50-55%, mildly dil LA/RA.  . CKD (chronic kidney disease)   . Coronary artery disease    a. s/p LAD stent 03/2003. b. Recurrent CP and abnormal nuc s/p CABGx2 (LIMA-LAD, SVG-Diag) in 10/2003. c. Last nuc 2010 reportedly normal per Jacqueline Zuniga note.  . DVT of deep femoral vein ([place])    a. 02/2013 acute DVT of left common femoral vein-->coumadin initiated.  Marland Kitchen GERD (gastroesophageal reflux disease)   . H/O: GI  bleed    a. Anemia 2004: EGD showing a partially healed duodenal ulcer. b. Ulcerative reflux esophagitis and schatzki ring by EGD 2010.  Marland Kitchen History of blood transfusion    "several"   . hx: breast cancer, right IDC ER + PR - Her 2 -, left paget's disease/DCIS receptor -  08/12/2011   Patient diagnosed with right breast invasive ductal carcinoma 04/25/2008. Patient underwent right partial mastectomy on 05/21/2008. Pathology showed invasive ductal carcinoma, margins negative. ER positive at 99%. PR negative. Her 2 negative. Ki 67 at 25%. Patient then diagnosed with left nipple paget's disease on 04/01/2010. Patient underwent left lumpectomy on 04/30/2010. Pathology showed paget's dise  . Hypercholesterolemia   . Hypertension   . Lumbar stenosis   . Pulmonary embolism ([place])    a. 02/2013 intermediate to high risk V:Q scan-->coumadin initiated.  . Schatzki's ring   . Skin cancer of face    "forehead and both temples"   . Type II diabetes mellitus ([place])   . Uterine cancer ([place]) 1966    Past Surgical History:  Procedure Laterality Date  . ABDOMINAL HYSTERECTOMY  1966  . APPENDECTOMY  1966  . BREAST LUMPECTOMY Right 2010  . CATARACT EXTRACTION W/ INTRAOCULAR LENS  IMPLANT, BILATERAL Bilateral ~ 2007  . CORONARY ANGIOPLASTY WITH STENT PLACEMENT  04/01/2003   1. Left heart catheterization. 2. Coronary  angiography  3. Left ventricular angiography. 4 . Stenting of the mid left anterior descending.  . CORONARY ARTERY BYPASS GRAFT  2005   CABG X2  . DILATION AND CURETTAGE OF UTERUS    . MASTECTOMY PARTIAL / LUMPECTOMY Left 2012  . SHOULDER ARTHROSCOPY W/ ROTATOR CUFF REPAIR Left     Social History   Tobacco Use  Smoking Status Former Smoker  . Packs/day: 0.50  . Years: 4.00  . Pack years: 2.00  . Types: Cigarettes  . Last attempt to quit: 03/29/1964  . Years since quitting: 53.7  Smokeless Tobacco Never Used    Social History   Substance and Sexual Activity  Alcohol Use No    Family  History  Problem Relation Age of Onset  . Heart failure Mother   . Heart disease Mother   . Congestive Heart Failure Mother   . Coronary artery disease Father   . Heart attack Brother   . CVA Brother   . Congestive Heart Failure Sister   . Heart attack Brother   . Heart failure Sister   . Stroke Brother        had bypass surgery    Review of Systems: As noted in history of present illness. All other systems were reviewed and are negative.  Physical Exam: BP (!) 128/56 (BP Location: Left Arm, Patient Position: Sitting, Cuff Size: Normal)   Pulse 60   Ht 5\' 1"  (1.549 m)   Wt 162 lb (73.5 kg)   BMI 30.61 kg/m  GENERAL:  Elderly WF obese HEENT:  PERRL, EOMI, sclera are clear. Oropharynx is clear. NECK:  No jugular venous distention, carotid upstroke brisk and symmetric, left carotid bruit, no thyromegaly or adenopathy LUNGS:  Clear to auscultation bilaterally CHEST:  Unremarkable HEART:  RRR,  PMI not displaced or sustained,S1 and S2 within normal limits, no S3, no S4: no clicks, no rubs, no murmurs ABD:  Soft, nontender. BS +, no masses or bruits. No hepatomegaly, no splenomegaly EXT:  2 + pulses throughout,Tr edema, no cyanosis no clubbing. Gouty arthritis in hands. SKIN:  Warm and dry.  No rashes NEURO:  Alert and oriented x 3. Cranial nerves II through XII intact. PSYCH:  Cognitively intact    LABORATORY: Labs from primary care dated 12/11/15: cholesterol 178, triglycerides 158, LDL 95, HDL 51. BUN 30, creatinine 1.2. Other chemistries normal. A1c 7.8%. HCt and TSH normal. Dated 11/01/16: A1c 7%. Creatinine 1.4. Other chemistries and CBC normal.  Ecg today shows NSR rate 60. LAD, LBBB. I have personally reviewed and interpreted this study.  Assessment / Plan: 1. History of PE/DVT in December 2014.  Recurrence Feb. 2017. Now on chronic anticoagulation with Xarelto. Needs to continue indefinitely.  On oxygen therapy  2. Coronary disease status post remote stenting of the  LAD.  Continue  carvedilol. Risk factor modification. She is asymptomatic will follow clinically. Off ASA now that she is on Xarelto.   2. Hypertension, well controlled on amlodipine and metoprolol. She has a history of cough on ACE inhibitors. Renal insufficiency on Diovan HCT.   3. Diabetes mellitus type 2.  4. Hypercholesterolemia.  5. Paroxysmal Afib in setting of PNA and PE.  NSR today.  6. Left carotid bruit. Will check dopplers.  7. Gout.   I will follow up in 6 months.

## 2017-12-07 ENCOUNTER — Encounter

## 2017-12-07 ENCOUNTER — Encounter: Payer: Self-pay | Admitting: Cardiology

## 2017-12-07 ENCOUNTER — Ambulatory Visit (INDEPENDENT_AMBULATORY_CARE_PROVIDER_SITE_OTHER): Payer: Medicare Other | Admitting: Cardiology

## 2017-12-07 VITALS — BP 128/56 | HR 60 | Ht 61.0 in | Wt 162.0 lb

## 2017-12-07 DIAGNOSIS — I10 Essential (primary) hypertension: Secondary | ICD-10-CM | POA: Diagnosis not present

## 2017-12-07 DIAGNOSIS — R0989 Other specified symptoms and signs involving the circulatory and respiratory systems: Secondary | ICD-10-CM

## 2017-12-07 DIAGNOSIS — Z86711 Personal history of pulmonary embolism: Secondary | ICD-10-CM

## 2017-12-07 DIAGNOSIS — I2510 Atherosclerotic heart disease of native coronary artery without angina pectoris: Secondary | ICD-10-CM

## 2017-12-07 NOTE — Patient Instructions (Addendum)
We will schedule you for carotid dopplers  Continue your current therapy  I will see you in one year

## 2017-12-09 ENCOUNTER — Telehealth: Payer: Self-pay | Admitting: Cardiology

## 2017-12-09 NOTE — Telephone Encounter (Signed)
Error no note needed °

## 2017-12-13 ENCOUNTER — Other Ambulatory Visit: Payer: Self-pay | Admitting: Cardiology

## 2017-12-13 DIAGNOSIS — R0989 Other specified symptoms and signs involving the circulatory and respiratory systems: Secondary | ICD-10-CM

## 2017-12-14 ENCOUNTER — Ambulatory Visit (HOSPITAL_COMMUNITY)
Admission: RE | Admit: 2017-12-14 | Discharge: 2017-12-14 | Disposition: A | Discharge status: Home / Self Care | Payer: Medicare Other | Source: Ambulatory Visit | Attending: Cardiovascular Disease | Admitting: Cardiovascular Disease

## 2017-12-14 DIAGNOSIS — R0989 Other specified symptoms and signs involving the circulatory and respiratory systems: Secondary | ICD-10-CM | POA: Diagnosis present

## 2017-12-26 ENCOUNTER — Other Ambulatory Visit: Payer: Self-pay | Admitting: Cardiology

## 2017-12-31 ENCOUNTER — Other Ambulatory Visit: Payer: Self-pay | Admitting: Cardiology

## 2018-01-16 ENCOUNTER — Other Ambulatory Visit: Payer: Self-pay | Admitting: Cardiology

## 2018-05-19 ENCOUNTER — Other Ambulatory Visit: Payer: Self-pay | Admitting: Cardiology

## 2018-05-19 NOTE — Telephone Encounter (Signed)
Rx has been sent to the pharmacy electronically. ° °

## 2018-09-29 ENCOUNTER — Other Ambulatory Visit: Payer: Self-pay | Admitting: Cardiology

## 2018-10-02 NOTE — Telephone Encounter (Signed)
Rx(s) sent to pharmacy electronically.  

## 2018-11-20 ENCOUNTER — Other Ambulatory Visit: Payer: Self-pay | Admitting: Cardiology

## 2019-01-11 ENCOUNTER — Other Ambulatory Visit: Payer: Self-pay | Admitting: Cardiology

## 2019-04-03 ENCOUNTER — Other Ambulatory Visit: Payer: Self-pay | Admitting: Cardiology

## 2019-05-09 NOTE — Progress Notes (Unsigned)
{Choose 1 Note Type (Telehealth Visit or Telephone Visit):[phone]}   Date:  05/09/2019   ID:  Jacqueline Zuniga, DOB 1926-02-23, MRN [id number]  Patient Location: Home Provider Location: Home  PCP:  Leanna Battles, MD  Cardiologist:  Chaka Jefferys [place] MD Electrophysiologist:  None   Evaluation Performed:  Follow-Up Visit  Chief Complaint:  CAD  History of Present Illness:    Jacqueline Zuniga is a 86 y.o. female with a history of coronary disease and is status post stenting of the LAD in 2005. Her last stress test in February 2010 was normal. She also a history of diabetes mellitus, hypertension, and hyperlipidemia. She was admitted in December 2014 with an acute PE and DVT. Because of CKD she was not a candidate for one of the newer anticoagulants and was placed on coumadin. She was evaluated by [name]. PFTs were normal. V/Q scan showed improvement in right lung perfusion. CXR was ok. LE venous dopplers were negative for DVT. No further recommendations.   She was admitted to [facility] in February 2017 with the flu, PNA, recurrent PE,and paroxysmal Afib.   She is now on Xarelto. Still limited by back pain from spinal stenosis. She is SOB just walking across the room.She is wearing oxygen more.  Still feels quite SOB with house work or bending over.  The patient {does/does not:200015} have symptoms concerning for COVID-19 infection (fever, chills, cough, or new shortness of breath).    Past Medical History:  Diagnosis Date  . Anticoagulated on Coumadin    a. 02/2013 LCFV DVT/PE.  Marland Kitchen Arthritis    "left shoulder; hips" (03/07/2013)  . Breast cancer ([place])    "both breasts"   . Chronic anemia   . Chronic lower back pain   . Chronic systolic CHF (congestive heart failure) ([place])    a. EF 35-40% by echo 2005;  b. 02/2013 Echo: EF 50-55%, mildly dil LA/RA.  . CKD (chronic kidney disease)   . Coronary artery disease    a. s/p LAD stent 03/2003. b. Recurrent CP and abnormal nuc s/p  CABGx2 (LIMA-LAD, SVG-Diag) in 10/2003. c. Last nuc 2010 reportedly normal per [name] note.  . DVT of deep femoral vein ([place])    a. 02/2013 acute DVT of left common femoral vein-->coumadin initiated.  Marland Kitchen GERD (gastroesophageal reflux disease)   . H/O: GI bleed    a. Anemia 2004: EGD showing a partially healed duodenal ulcer. b. Ulcerative reflux esophagitis and schatzki ring by EGD 2010.  Marland Kitchen History of blood transfusion    "several"   . hx: breast cancer, right IDC ER + PR - Her 2 -, left paget's disease/DCIS receptor -  08/12/2011   Patient diagnosed with right breast invasive ductal carcinoma 04/25/2008. Patient underwent right partial mastectomy on 05/21/2008. Pathology showed invasive ductal carcinoma, margins negative. ER positive at 99%. PR negative. Her 2 negative. Ki 67 at 25%. Patient then diagnosed with left nipple paget's disease on 04/01/2010. Patient underwent left lumpectomy on 04/30/2010. Pathology showed paget's dise  . Hypercholesterolemia   . Hypertension   . Lumbar stenosis   . Pulmonary embolism ([place])    a. 02/2013 intermediate to high risk V:Q scan-->coumadin initiated.  . Schatzki's ring   . Skin cancer of face    "forehead and both temples"   . Type II diabetes mellitus ([place])   . Uterine cancer ([place]) 1966   Past Surgical History:  Procedure Laterality Date  . ABDOMINAL HYSTERECTOMY  1966  . APPENDECTOMY  1966  .  BREAST LUMPECTOMY Right 2010  . CATARACT EXTRACTION W/ INTRAOCULAR LENS  IMPLANT, BILATERAL Bilateral ~ 2007  . CORONARY ANGIOPLASTY WITH STENT PLACEMENT  04/01/2003   1. Left heart catheterization. 2. Coronary angiography  3. Left ventricular angiography. 4 . Stenting of the mid left anterior descending.  . CORONARY ARTERY BYPASS GRAFT  2005   CABG X2  . DILATION AND CURETTAGE OF UTERUS    . MASTECTOMY PARTIAL / LUMPECTOMY Left 2012  . SHOULDER ARTHROSCOPY W/ ROTATOR CUFF REPAIR Left      No outpatient medications have been marked as taking for the  05/10/19 encounter (Appointment) with [place], Slayde Brault M, MD.     Allergies:   Morphine and Morphine and related   Social History   Tobacco Use  . Smoking status: Former Smoker    Packs/day: 0.50    Years: 4.00    Pack years: 2.00    Types: Cigarettes    Quit date: 03/29/1964    Years since quitting: 55.1  . Smokeless tobacco: Never Used  Substance Use Topics  . Alcohol use: No  . Drug use: No     Family Hx: The patient's family history includes CVA in her brother; Congestive Heart Failure in her mother and sister; Coronary artery disease in her father; Heart attack in her brother and brother; Heart disease in her mother; Heart failure in her mother and sister; Stroke in her brother.  ROS:   Please see the history of present illness.    *** All other systems reviewed and are negative.   Prior CV studies:   The following studies were reviewed today:  ***  Labs/Other Tests and Data Reviewed:    EKG:  {EKG/Telemetry Strips Reviewed:[phone]}  Recent Labs: No results found for requested labs within last 8760 hours.   Recent Lipid Panel No results found for: CHOL, TRIG, HDL, CHOLHDL, LDLCALC, LDLDIRECT   Labs from primary care dated 12/11/15: cholesterol 178, triglycerides 158, LDL 95, HDL 51. BUN 30, creatinine 1.2. Other chemistries normal. A1c 7.8%. HCt and TSH normal. Dated 11/01/16: A1c 7%. Creatinine 1.4. Other chemistries and CBC normal. Dated 07/18/17: creatinine 1.4. BUN 32.   Wt Readings from Last 3 Encounters:  12/07/17 162 lb (73.5 kg)  12/02/16 169 lb 6.4 oz (76.8 kg)  12/18/15 166 lb 3.2 oz (75.4 kg)     Objective:    Vital Signs:  There were no vitals taken for this visit.   {[facility] Virtual Exam (Optional):[phone]::"VITAL SIGNS:  reviewed"}  ASSESSMENT & PLAN:    1. History of PE/DVT in December 2014.  Recurrence Feb. 2017. Now on chronic anticoagulation with Xarelto. Needs to continue indefinitely.  On oxygen therapy  2. Coronary disease  status post remote stenting of the LAD.  Continue  carvedilol. Risk factor modification. She is asymptomatic will follow clinically. Off ASA now that she is on Xarelto.   2. Hypertension, well controlled on amlodipine and metoprolol. She has a history of cough on ACE inhibitors. Renal insufficiency on Diovan HCT.   3. Diabetes mellitus type 2.  4. Hypercholesterolemia.  5. Paroxysmal Afib in setting of PNA and PE.  NSR today.  6. Left carotid bruit. Will check dopplers.  7. Gout.   COVID-19 Education: The signs and symptoms of COVID-19 were discussed with the patient and how to seek care for testing (follow up with PCP or arrange E-visit).  ***The importance of social distancing was discussed today.  Time:   Today, I have spent *** minutes with the patient  with telehealth technology discussing the above problems.     Medication Adjustments/Labs and Tests Ordered: Current medicines are reviewed at length with the patient today.  Concerns regarding medicines are outlined above.   Tests Ordered: No orders of the defined types were placed in this encounter.   Medication Changes: No orders of the defined types were placed in this encounter.   Follow Up:  {F/U Format:[phone]} {follow up:15908}  Signed, Neala Miggins [place], MD  05/09/2019 11:17 AM    [facility]

## 2019-05-10 ENCOUNTER — Telehealth: Payer: Medicare Other | Admitting: Cardiology

## 2019-05-14 ENCOUNTER — Other Ambulatory Visit: Payer: Self-pay | Admitting: Cardiology

## 2019-05-18 ENCOUNTER — Other Ambulatory Visit: Payer: Self-pay | Admitting: Cardiology

## 2019-05-18 NOTE — Telephone Encounter (Signed)
New Message     *STAT* If patient is at the pharmacy, call can be transferred to refill team.   1. Which medications need to be refilled? (please list name of each medication and dose if known) carvedilol (COREG) 12.5 MG tablet  2. Which pharmacy/location (including street and city if local pharmacy) is medication to be sent to? [address], [place]  3. Do they need a 30 day or 90 day supply? 30 or 90

## 2019-05-21 ENCOUNTER — Other Ambulatory Visit: Payer: Self-pay | Admitting: Cardiology

## 2019-06-14 ENCOUNTER — Other Ambulatory Visit: Payer: Self-pay | Admitting: Cardiology

## 2019-07-05 NOTE — Progress Notes (Deleted)
Jacqueline Zuniga Date of Birth: 1925-08-31   History of Present Illness: Jacqueline Zuniga is seen for followup CAD She has a history of coronary disease and is status post stenting of the LAD in 2005. Her last stress test in February 2010 was normal. She also a history of diabetes mellitus, hypertension, and hyperlipidemia. She was admitted in December 2014 with an acute PE and DVT. Because of CKD she was not a candidate for one of the newer anticoagulants and was placed on coumadin. She was evaluated by [name]. PFTs were normal. V/Q scan showed improvement in right lung perfusion. CXR was ok. LE venous dopplers were negative for DVT. No further recommendations.  Carotid dopplers in 2019 showed no significant stenosis.   She was admitted to [facility] in February 2017 with the flu, PNA, recurrent PE,and paroxysmal Afib.   She is now on Xarelto. Still limited by back pain from spinal stenosis. She is SOB just walking across the room. States something was seen on eye exam and Dr Philip Aspen wanted to get carotid dopplers. She is wearing oxygen more.  Still feels quite SOB with house work or bending over.   Allergies  Allergen Reactions  . Morphine   . Morphine And Related Nausea And Vomiting    Past Medical History:  Diagnosis Date  . Anticoagulated on Coumadin    a. 02/2013 LCFV DVT/PE.  Marland Kitchen Arthritis    "left shoulder; hips" (03/07/2013)  . Breast cancer ([place])    "both breasts"   . Chronic anemia   . Chronic lower back pain   . Chronic systolic CHF (congestive heart failure) ([place])    a. EF 35-40% by echo 2005;  b. 02/2013 Echo: EF 50-55%, mildly dil LA/RA.  . CKD (chronic kidney disease)   . Coronary artery disease    a. s/p LAD stent 03/2003. b. Recurrent CP and abnormal nuc s/p CABGx2 (LIMA-LAD, SVG-Diag) in 10/2003. c. Last nuc 2010 reportedly normal per [name] note.  . DVT of deep femoral vein ([place])    a. 02/2013 acute DVT of left common femoral vein-->coumadin initiated.   Marland Kitchen GERD (gastroesophageal reflux disease)   . H/O: GI bleed    a. Anemia 2004: EGD showing a partially healed duodenal ulcer. b. Ulcerative reflux esophagitis and schatzki ring by EGD 2010.  Marland Kitchen History of blood transfusion    "several"   . hx: breast cancer, right IDC ER + PR - Her 2 -, left paget's disease/DCIS receptor -  08/12/2011   Patient diagnosed with right breast invasive ductal carcinoma 04/25/2008. Patient underwent right partial mastectomy on 05/21/2008. Pathology showed invasive ductal carcinoma, margins negative. ER positive at 99%. PR negative. Her 2 negative. Ki 67 at 25%. Patient then diagnosed with left nipple paget's disease on 04/01/2010. Patient underwent left lumpectomy on 04/30/2010. Pathology showed paget's dise  . Hypercholesterolemia   . Hypertension   . Lumbar stenosis   . Pulmonary embolism ([place])    a. 02/2013 intermediate to high risk V:Q scan-->coumadin initiated.  . Schatzki's ring   . Skin cancer of face    "forehead and both temples"   . Type II diabetes mellitus ([place])   . Uterine cancer ([place]) 1966    Past Surgical History:  Procedure Laterality Date  . ABDOMINAL HYSTERECTOMY  1966  . APPENDECTOMY  1966  . BREAST LUMPECTOMY Right 2010  . CATARACT EXTRACTION W/ INTRAOCULAR LENS  IMPLANT, BILATERAL Bilateral ~ 2007  . CORONARY ANGIOPLASTY WITH STENT PLACEMENT  04/01/2003  1. Left heart catheterization. 2. Coronary angiography  3. Left ventricular angiography. 4 . Stenting of the mid left anterior descending.  . CORONARY ARTERY BYPASS GRAFT  2005   CABG X2  . DILATION AND CURETTAGE OF UTERUS    . MASTECTOMY PARTIAL / LUMPECTOMY Left 2012  . SHOULDER ARTHROSCOPY W/ ROTATOR CUFF REPAIR Left     Social History   Tobacco Use  Smoking Status Former Smoker  . Packs/day: 0.50  . Years: 4.00  . Pack years: 2.00  . Types: Cigarettes  . Quit date: 03/29/1964  . Years since quitting: 55.3  Smokeless Tobacco Never Used    Social History   Substance and Sexual  Activity  Alcohol Use No    Family History  Problem Relation Age of Onset  . Heart failure Mother   . Heart disease Mother   . Congestive Heart Failure Mother   . Coronary artery disease Father   . Heart attack Brother   . CVA Brother   . Congestive Heart Failure Sister   . Heart attack Brother   . Heart failure Sister   . Stroke Brother        had bypass surgery    Review of Systems: As noted in history of present illness. All other systems were reviewed and are negative.  Physical Exam: There were no vitals taken for this visit. GENERAL:  Elderly WF obese HEENT:  PERRL, EOMI, sclera are clear. Oropharynx is clear. NECK:  No jugular venous distention, carotid upstroke brisk and symmetric, left carotid bruit, no thyromegaly or adenopathy LUNGS:  Clear to auscultation bilaterally CHEST:  Unremarkable HEART:  RRR,  PMI not displaced or sustained,S1 and S2 within normal limits, no S3, no S4: no clicks, no rubs, no murmurs ABD:  Soft, nontender. BS +, no masses or bruits. No hepatomegaly, no splenomegaly EXT:  2 + pulses throughout,Tr edema, no cyanosis no clubbing. Gouty arthritis in hands. SKIN:  Warm and dry.  No rashes NEURO:  Alert and oriented x 3. Cranial nerves II through XII intact. PSYCH:  Cognitively intact    LABORATORY: Labs from primary care dated 12/11/15: cholesterol 178, triglycerides 158, LDL 95, HDL 51. BUN 30, creatinine 1.2. Other chemistries normal. A1c 7.8%. HCt and TSH normal. Dated 11/01/16: A1c 7%. Creatinine 1.4. Other chemistries and CBC normal. Dated 08/18/17: A1c 6.8%.  Dated 07/18/17: BUN 32. Creatinine 1.4.   Ecg today shows NSR rate 60. LAD, LBBB. I have personally reviewed and interpreted this study.  Assessment / Plan: 1. History of PE/DVT in December 2014.  Recurrence Feb. 2017. Now on chronic anticoagulation with Xarelto. Needs to continue indefinitely.  On oxygen therapy  2. Coronary disease status post remote stenting of the LAD.   Continue  carvedilol. Risk factor modification. She is asymptomatic will follow clinically. Off ASA now that she is on Xarelto.   2. Hypertension, well controlled on amlodipine and metoprolol. She has a history of cough on ACE inhibitors. Renal insufficiency on Diovan HCT.   3. Diabetes mellitus type 2.  4. Hypercholesterolemia.  5. Paroxysmal Afib in setting of PNA and PE.  NSR today.  6. Left carotid bruit. Prior carotid dopplers were negative.   7. Gout.   I will follow up in 6 months.

## 2019-07-06 ENCOUNTER — Ambulatory Visit: Payer: Medicare Other | Admitting: Cardiology

## 2019-07-16 ENCOUNTER — Other Ambulatory Visit: Payer: Self-pay | Admitting: Cardiology

## 2019-07-25 ENCOUNTER — Telehealth: Payer: Self-pay | Admitting: Cardiology

## 2019-07-25 NOTE — Telephone Encounter (Signed)
   Went to chart to see who called pt

## 2019-07-26 NOTE — Progress Notes (Deleted)
Jacqueline Zuniga Date of Birth: 12-Jan-1926   History of Present Illness: Jacqueline Zuniga is seen for followup CAD She has a history of coronary disease and is status post stenting of the LAD in 2005. Her last stress test in February 2010 was normal. She also a history of diabetes mellitus, hypertension, and hyperlipidemia. She was admitted in December 2014 with an acute PE and DVT. Because of CKD she was not a candidate for one of the newer anticoagulants and was placed on coumadin. She was evaluated by [name]. PFTs were normal. V/Q scan showed improvement in right lung perfusion. CXR was ok. LE venous dopplers were negative for DVT. No further recommendations.  Carotid dopplers in 2019 showed no significant stenosis.   She was admitted to [facility] in February 2017 with the flu, PNA, recurrent PE,and paroxysmal Afib.   She is now on Xarelto. Still limited by back pain from spinal stenosis. She is SOB just walking across the room. States something was seen on eye exam and Dr Philip Aspen wanted to get carotid dopplers. She is wearing oxygen more.  Still feels quite SOB with house work or bending over.   Allergies  Allergen Reactions  . Morphine   . Morphine And Related Nausea And Vomiting    Past Medical History:  Diagnosis Date  . Anticoagulated on Coumadin    a. 02/2013 LCFV DVT/PE.  Marland Kitchen Arthritis    "left shoulder; hips" (03/07/2013)  . Breast cancer ([place])    "both breasts"   . Chronic anemia   . Chronic lower back pain   . Chronic systolic CHF (congestive heart failure) ([place])    a. EF 35-40% by echo 2005;  b. 02/2013 Echo: EF 50-55%, mildly dil LA/RA.  . CKD (chronic kidney disease)   . Coronary artery disease    a. s/p LAD stent 03/2003. b. Recurrent CP and abnormal nuc s/p CABGx2 (LIMA-LAD, SVG-Diag) in 10/2003. c. Last nuc 2010 reportedly normal per [name] note.  . DVT of deep femoral vein ([place])    a. 02/2013 acute DVT of left common femoral vein-->coumadin initiated.   Marland Kitchen GERD (gastroesophageal reflux disease)   . H/O: GI bleed    a. Anemia 2004: EGD showing a partially healed duodenal ulcer. b. Ulcerative reflux esophagitis and schatzki ring by EGD 2010.  Marland Kitchen History of blood transfusion    "several"   . hx: breast cancer, right IDC ER + PR - Her 2 -, left paget's disease/DCIS receptor -  08/12/2011   Patient diagnosed with right breast invasive ductal carcinoma 04/25/2008. Patient underwent right partial mastectomy on 05/21/2008. Pathology showed invasive ductal carcinoma, margins negative. ER positive at 99%. PR negative. Her 2 negative. Ki 67 at 25%. Patient then diagnosed with left nipple paget's disease on 04/01/2010. Patient underwent left lumpectomy on 04/30/2010. Pathology showed paget's dise  . Hypercholesterolemia   . Hypertension   . Lumbar stenosis   . Pulmonary embolism ([place])    a. 02/2013 intermediate to high risk V:Q scan-->coumadin initiated.  . Schatzki's ring   . Skin cancer of face    "forehead and both temples"   . Type II diabetes mellitus ([place])   . Uterine cancer ([place]) 1966    Past Surgical History:  Procedure Laterality Date  . ABDOMINAL HYSTERECTOMY  1966  . APPENDECTOMY  1966  . BREAST LUMPECTOMY Right 2010  . CATARACT EXTRACTION W/ INTRAOCULAR LENS  IMPLANT, BILATERAL Bilateral ~ 2007  . CORONARY ANGIOPLASTY WITH STENT PLACEMENT  04/01/2003  1. Left heart catheterization. 2. Coronary angiography  3. Left ventricular angiography. 4 . Stenting of the mid left anterior descending.  . CORONARY ARTERY BYPASS GRAFT  2005   CABG X2  . DILATION AND CURETTAGE OF UTERUS    . MASTECTOMY PARTIAL / LUMPECTOMY Left 2012  . SHOULDER ARTHROSCOPY W/ ROTATOR CUFF REPAIR Left     Social History   Tobacco Use  Smoking Status Former Smoker  . Packs/day: 0.50  . Years: 4.00  . Pack years: 2.00  . Types: Cigarettes  . Quit date: 03/29/1964  . Years since quitting: 55.3  Smokeless Tobacco Never Used    Social History   Substance and Sexual  Activity  Alcohol Use No    Family History  Problem Relation Age of Onset  . Heart failure Mother   . Heart disease Mother   . Congestive Heart Failure Mother   . Coronary artery disease Father   . Heart attack Brother   . CVA Brother   . Congestive Heart Failure Sister   . Heart attack Brother   . Heart failure Sister   . Stroke Brother        had bypass surgery    Review of Systems: As noted in history of present illness. All other systems were reviewed and are negative.  Physical Exam: There were no vitals taken for this visit. GENERAL:  Elderly WF obese HEENT:  PERRL, EOMI, sclera are clear. Oropharynx is clear. NECK:  No jugular venous distention, carotid upstroke brisk and symmetric, left carotid bruit, no thyromegaly or adenopathy LUNGS:  Clear to auscultation bilaterally CHEST:  Unremarkable HEART:  RRR,  PMI not displaced or sustained,S1 and S2 within normal limits, no S3, no S4: no clicks, no rubs, no murmurs ABD:  Soft, nontender. BS +, no masses or bruits. No hepatomegaly, no splenomegaly EXT:  2 + pulses throughout,Tr edema, no cyanosis no clubbing. Gouty arthritis in hands. SKIN:  Warm and dry.  No rashes NEURO:  Alert and oriented x 3. Cranial nerves II through XII intact. PSYCH:  Cognitively intact    LABORATORY: Labs from primary care dated 12/11/15: cholesterol 178, triglycerides 158, LDL 95, HDL 51. BUN 30, creatinine 1.2. Other chemistries normal. A1c 7.8%. HCt and TSH normal. Dated 11/01/16: A1c 7%. Creatinine 1.4. Other chemistries and CBC normal. Dated 08/18/17: A1c 6.8%.  Dated 07/18/17: BUN 32. Creatinine 1.4.    Ecg today shows NSR rate 60. LAD, LBBB. I have personally reviewed and interpreted this study.  Assessment / Plan: 1. History of PE/DVT in December 2014.  Recurrence Feb. 2017. Now on chronic anticoagulation with Xarelto. Needs to continue indefinitely.  On oxygen therapy  2. Coronary disease status post remote stenting of the LAD.   Continue  carvedilol. Risk factor modification. She is asymptomatic will follow clinically. Off ASA now that she is on Xarelto.   2. Hypertension, well controlled on amlodipine and metoprolol. She has a history of cough on ACE inhibitors. Renal insufficiency on Diovan HCT.   3. Diabetes mellitus type 2.  4. Hypercholesterolemia.  5. Paroxysmal Afib in setting of PNA and PE.  NSR today.  6. Left carotid bruit. Prior carotid dopplers were negative.   7. Gout.   I will follow up in 6 months.

## 2019-07-30 ENCOUNTER — Ambulatory Visit: Payer: Medicare Other | Admitting: Cardiology

## 2019-07-31 ENCOUNTER — Other Ambulatory Visit: Payer: Self-pay | Admitting: Cardiology

## 2019-08-02 ENCOUNTER — Other Ambulatory Visit: Payer: Self-pay | Admitting: Cardiology

## 2019-09-21 NOTE — Progress Notes (Signed)
Jacqueline Zuniga Date of Birth: November 19, 1925   History of Present Illness: Jacqueline Zuniga is seen for followup CAD She has a history of coronary disease and is status post stenting of the LAD in 2005. Her last stress test in February 2010 was normal. She also a history of diabetes mellitus, hypertension, and hyperlipidemia. She was admitted in December 2014 with an acute PE and DVT. Because of CKD she was not a candidate for one of the newer anticoagulants and was placed on coumadin. She was evaluated by [name]. PFTs were normal. V/Q scan showed improvement in right lung perfusion. CXR was ok. LE venous dopplers were negative for DVT. No further recommendations.    She was admitted to [facility] in February 2017 with the flu, PNA, recurrent PE,and paroxysmal Afib.   She is now on Xarelto. Still limited by back pain from spinal stenosis. She is SOB just walking across the room. She still lives by herself and does her own cooking. Son looks in on her and grocery shops for her.  She is wearing oxygen at night.  Has chronic LE edema.   Allergies  Allergen Reactions  . Morphine   . Morphine And Related Nausea And Vomiting    Past Medical History:  Diagnosis Date  . Anticoagulated on Coumadin    a. 02/2013 LCFV DVT/PE.  Marland Kitchen Arthritis    "left shoulder; hips" (03/07/2013)  . Breast cancer ([place])    "both breasts"   . Chronic anemia   . Chronic lower back pain   . Chronic systolic CHF (congestive heart failure) ([place])    a. EF 35-40% by echo 2005;  b. 02/2013 Echo: EF 50-55%, mildly dil LA/RA.  . CKD (chronic kidney disease)   . Coronary artery disease    a. s/p LAD stent 03/2003. b. Recurrent CP and abnormal nuc s/p CABGx2 (LIMA-LAD, SVG-Diag) in 10/2003. c. Last nuc 2010 reportedly normal per [name] note.  . DVT of deep femoral vein ([place])    a. 02/2013 acute DVT of left common femoral vein-->coumadin initiated.  Marland Kitchen GERD (gastroesophageal reflux disease)   . H/O: GI bleed    a.  Anemia 2004: EGD showing a partially healed duodenal ulcer. b. Ulcerative reflux esophagitis and schatzki ring by EGD 2010.  Marland Kitchen History of blood transfusion    "several"   . hx: breast cancer, right IDC ER + PR - Her 2 -, left paget's disease/DCIS receptor -  08/12/2011   Patient diagnosed with right breast invasive ductal carcinoma 04/25/2008. Patient underwent right partial mastectomy on 05/21/2008. Pathology showed invasive ductal carcinoma, margins negative. ER positive at 99%. PR negative. Her 2 negative. Ki 67 at 25%. Patient then diagnosed with left nipple paget's disease on 04/01/2010. Patient underwent left lumpectomy on 04/30/2010. Pathology showed paget's dise  . Hypercholesterolemia   . Hypertension   . Lumbar stenosis   . Pulmonary embolism ([place])    a. 02/2013 intermediate to high risk V:Q scan-->coumadin initiated.  . Schatzki's ring   . Skin cancer of face    "forehead and both temples"   . Type II diabetes mellitus ([place])   . Uterine cancer ([place]) 1966    Past Surgical History:  Procedure Laterality Date  . ABDOMINAL HYSTERECTOMY  1966  . APPENDECTOMY  1966  . BREAST LUMPECTOMY Right 2010  . CATARACT EXTRACTION W/ INTRAOCULAR LENS  IMPLANT, BILATERAL Bilateral ~ 2007  . CORONARY ANGIOPLASTY WITH STENT PLACEMENT  04/01/2003   1. Left heart catheterization. 2.  Coronary angiography  3. Left ventricular angiography. 4 . Stenting of the mid left anterior descending.  . CORONARY ARTERY BYPASS GRAFT  2005   CABG X2  . DILATION AND CURETTAGE OF UTERUS    . MASTECTOMY PARTIAL / LUMPECTOMY Left 2012  . SHOULDER ARTHROSCOPY W/ ROTATOR CUFF REPAIR Left     Social History   Tobacco Use  Smoking Status Former Smoker  . Packs/day: 0.50  . Years: 4.00  . Pack years: 2.00  . Types: Cigarettes  . Quit date: 03/29/1964  . Years since quitting: 55.5  Smokeless Tobacco Never Used    Social History   Substance and Sexual Activity  Alcohol Use No    Family History  Problem Relation Age  of Onset  . Heart failure Mother   . Heart disease Mother   . Congestive Heart Failure Mother   . Coronary artery disease Father   . Heart attack Brother   . CVA Brother   . Congestive Heart Failure Sister   . Heart attack Brother   . Heart failure Sister   . Stroke Brother        had bypass surgery    Review of Systems: As noted in history of present illness. All other systems were reviewed and are negative.  Physical Exam: BP (!) 116/52   Pulse (!) 54   Ht 5\' 2"  (1.575 m)   Wt 167 lb (75.8 kg)   SpO2 92%   BMI 30.54 kg/m  GENERAL:  Elderly WF obese- seen in wheelchair HEENT:  PERRL, EOMI, sclera are clear. Oropharynx is clear. NECK:  No jugular venous distention, carotid upstroke brisk and symmetric, soft left carotid bruit, no thyromegaly or adenopathy LUNGS:  Clear to auscultation bilaterally CHEST:  Unremarkable HEART:  RRR,  PMI not displaced or sustained,S1 and S2 within normal limits, no S3, no S4: no clicks, no rubs, no murmurs ABD:  Soft, nontender. BS +, no masses or bruits. No hepatomegaly, no splenomegaly EXT:  2 + pulses throughout,1-2+ pedal edema, no cyanosis no clubbing. Gouty arthritis in hands. SKIN:  Warm and dry.  No rashes NEURO:  Alert and oriented x 3. Cranial nerves II through XII intact. PSYCH:  Cognitively intact    LABORATORY: Lab Results  Component Value Date   WBC 7.7 05/24/2014   HGB 13.7 05/24/2014   HCT 41.3 05/24/2014   PLT 274.0 05/24/2014   GLUCOSE 135 (H) 05/24/2014   ALT 12 11/03/2008   AST 25 11/03/2008   NA 143 05/24/2014   K 4.1 05/24/2014   CL 105 05/24/2014   CREATININE 1.69 (H) 05/24/2014   BUN 45 (H) 05/24/2014   CO2 31 05/24/2014   TSH 1.87 05/24/2014   INR 2.43 (H) 03/12/2013   HGBA1C  11/03/2008    6.1 (NOTE) The ADA recommends the following therapeutic goal for glycemic control related to Hgb A1c measurement: Goal of therapy: <6.5 Hgb A1c  Reference: American Diabetes Association: Clinical Practice  Recommendations 2010, Diabetes Care, 2010, 33: (Suppl  1).    Labs from primary care dated 12/11/15: cholesterol 178, triglycerides 158, LDL 95, HDL 51. BUN 30, creatinine 1.2. Other chemistries normal. A1c 7.8%. HCt and TSH normal. Dated 11/01/16: A1c 7%. Creatinine 1.4. Other chemistries and CBC normal. Dated 07/18/17: A1c 6.8%. creatinine 1.4. LFTS normal.  Ecg today shows NSR rate 55. RAD, nonspecific IVCD. RVH.  I have personally reviewed and interpreted this study.  Assessment / Plan: 1. History of PE/DVT in December 2014.  Recurrence Feb.  2017. Now on chronic anticoagulation with Xarelto. Needs to continue indefinitely.  On oxygen therapy at night. Sat today looks OK.   2. Coronary disease status post remote stenting of the LAD.  Continue  carvedilol. Risk factor modification. She has no angina. Off ASA now that she is on Xarelto.   2. Hypertension, well controlled on amlodipine and metoprolol. She has a history of cough on ACE inhibitors. Renal insufficiency on Diovan HCT.   3. Diabetes mellitus type 2.  4. Hypercholesterolemia.  5. Paroxysmal Afib in setting of PNA and PE.  NSR today.  6. Left carotid bruit. Prior dopplers without significant stenosis.  She is overdue for lab work. She tells me she is planning to see Dr Philip Aspen soon and will get labs then. Will continue her current therapy.  I will follow up in one year

## 2019-09-25 ENCOUNTER — Ambulatory Visit: Payer: Medicare Other | Admitting: Cardiology

## 2019-09-25 ENCOUNTER — Other Ambulatory Visit: Payer: Self-pay

## 2019-09-25 ENCOUNTER — Encounter: Payer: Self-pay | Admitting: Cardiology

## 2019-09-25 VITALS — BP 116/52 | HR 54 | Ht 62.0 in | Wt 167.0 lb

## 2019-09-25 DIAGNOSIS — I10 Essential (primary) hypertension: Secondary | ICD-10-CM | POA: Diagnosis not present

## 2019-09-25 DIAGNOSIS — E7800 Pure hypercholesterolemia, unspecified: Secondary | ICD-10-CM

## 2019-09-25 DIAGNOSIS — Z86711 Personal history of pulmonary embolism: Secondary | ICD-10-CM

## 2019-09-25 DIAGNOSIS — I2510 Atherosclerotic heart disease of native coronary artery without angina pectoris: Secondary | ICD-10-CM | POA: Diagnosis not present

## 2019-09-25 MED ORDER — VITAMIN D (ERGOCALCIFEROL) 1.25 MG (50000 UNIT) PO CAPS: 50000.0000 [IU] | ORAL_CAPSULE | ORAL | 3 refills | Status: AC

## 2019-10-16 ENCOUNTER — Other Ambulatory Visit: Payer: Self-pay | Admitting: Cardiology

## 2019-10-26 ENCOUNTER — Other Ambulatory Visit: Payer: Self-pay | Admitting: Cardiology

## 2019-12-20 ENCOUNTER — Other Ambulatory Visit: Payer: Self-pay | Admitting: Cardiology

## 2020-01-18 ENCOUNTER — Other Ambulatory Visit: Payer: Self-pay | Admitting: Cardiology

## 2020-01-24 ENCOUNTER — Other Ambulatory Visit: Payer: Self-pay | Admitting: Cardiology

## 2020-04-22 ENCOUNTER — Other Ambulatory Visit: Payer: Self-pay | Admitting: Cardiology

## 2020-05-23 ENCOUNTER — Other Ambulatory Visit: Payer: Self-pay | Admitting: Cardiology

## 2020-06-25 ENCOUNTER — Other Ambulatory Visit: Payer: Self-pay | Admitting: Cardiology

## 2020-10-27 DEATH — deceased
